# Patient Record
Sex: Male | Born: 1955 | Race: White | Hispanic: No | Marital: Married | Smoking: Never smoker
Health system: Southern US, Community
[De-identification: ages and names within clinical notes are randomized; demographics above are authoritative.]

## PROBLEM LIST (undated history)

## (undated) DIAGNOSIS — E785 Hyperlipidemia, unspecified: Secondary | ICD-10-CM

## (undated) DIAGNOSIS — I471 Supraventricular tachycardia, unspecified: Secondary | ICD-10-CM

## (undated) DIAGNOSIS — I493 Ventricular premature depolarization: Secondary | ICD-10-CM

## (undated) HISTORY — DX: Supraventricular tachycardia: I47.1

## (undated) HISTORY — DX: Ventricular premature depolarization: I49.3

## (undated) HISTORY — DX: Supraventricular tachycardia, unspecified: I47.10

## (undated) HISTORY — DX: Hyperlipidemia, unspecified: E78.5

---

## 2005-05-25 ENCOUNTER — Emergency Department (HOSPITAL_COMMUNITY): Admission: EM | Admit: 2005-05-25 | Discharge: 2005-05-25 | Payer: Self-pay | Admitting: Emergency Medicine

## 2010-12-02 NOTE — Consult Note (Signed)
Martin [name] NO.:  [id number]   MEDICAL RECORD NO.:  [id number]          PATIENT TYPE:  EMS   LOCATION:  [facility]                         FACILITY:  [facility]   PHYSICIAN:  Vella Raring, MD DATE OF BIRTH:  [birth year]   DATE OF CONSULTATION:  05/25/2005  DATE OF DISCHARGE:                                   CONSULTATION   PRIMARY CARE PHYSICIAN:  Jethro Bastos, M.D.   CHIEF COMPLAINT:  Chest pain.   HISTORY OF PRESENT ILLNESS:  The patient is a 55 year old male with no  significant past medical history.  The patient does not smoke cigarettes.  He is not diabetic.  He, at one point, had elevated cholesterol, which was  controlled on diet.  The patient says he thinks his cholesterol became  elevated again, but he has not had his lipids checked recently.  He was  never on any medications for dyslipidemia.  The patient has a significant  family history for heart disease.  Father is deceased at age 34 secondary to  a heart attack, and his paternal grandmother and grandfather both had heart  attacks at age 41.  His mother does not have any cardiac problems.  The  patient does not use cocaine.  The patient states that this evening the  patient was getting ready to go to dinner at home, and he got a substernal  chest pain.  The pain was 3-4 in maximum intensity.  The pain did not  radiate.  It lasted 10 or 12 minutes.  It was intermittent during those 10-  12 minutes.  The patient got in the car and was driving; however, the  patient became concerned with the chest pain because he had never felt pain  like this before, and decided to come to the emergency room.  There was no  associated shortness of breath, no nausea, no sweating, no tingling or  numbness.  The pain did not radiate.  The patient said for briefly, less  than a minute, he felt slight discomfort in his left shoulder.  The pain  resolved on its own in 5-10 minutes.  The patient has been chest pain  free  from the time he came to the emergency room.  The patient said he had  another treadmill stress test 4 years ago.  The patient did not have any  chest pain that prompted the stress test, but had evaluation secondary to  his family history of heart disease.  The patient states he is physically  active and walks briskly on an incline treadmill at 3.8 miles per hour  frequently.  The patient never experiences any chest pain with walking  briskly on the treadmill.   PAST MEDICAL HISTORY:  As above.   SURGICAL HISTORY:  1.  The patient has had a tonsillectomy in the past.  2.  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   FAMILY HISTORY:  Again, the patient had a father with a heart attack at age  48.  Paternal grandfather and grandmother also had heart attacks  at age 51.  The patient's mother does not have any cardiac problems.  His brothers and  sisters are also healthy.   SOCIAL HISTORY:  The patient does not smoke cigarettes.  He does not use  illicit drugs.  The patient has not used any alcohol in the past 5 years;  however, he says he used to be a heavy drinker in the past.   MEDICATIONS:  The patient does not take any medicines.   ALLERGIES:  No known drug allergies.   PHYSICAL EXAMINATION:  VITAL SIGNS:  Blood pressure of 123/79, pulse 56,  respiratory rate 20, 02 saturation 99% on room air.  HEENT:  Normocephalic and atraumatic.  No carotid bruits were heard.  CVS:  S1 and S2 were bradycardic.  No murmurs, rubs, or gallops were heard.  RESPIRATORY:  Lungs were clear to auscultation bilaterally.  There was no  wheezing or rhonchi.  ABDOMEN:  Normoactive bowel sounds.  ABDOMEN:  Soft, nontender, nondistended.  EXTREMITIES:  The patient had no edema.  There were 2+ DP pulses  bilaterally.   LABORATORY DATA:  The patient had a white count of 6.7, hemoglobin 14,  hematocrit 42, platelet count of 225.  The patient had a troponin I of less  than 0.04, CK-MB  less than 1.  The patient had repeat data.  He had a  troponin I less than 0.05, CK-MB of 1.  On the third repeat, the patient had  a troponin I of less than 0.05 and CK-MB of less than 1.  The patient had a  Chem-7 that showed a sodium of 138, potassium 3.5, chloride 106, carbon  dioxide of 0.7, BUN of 9, creatinine 1, glucose 79, calcium 9.3.  The  patient had a chest x-ray done that showed no acute findings.   EKG FINDINGS:  The patient had an EKG that showed sinus bradycardia at 57  beats per minute.  There was no acute ST elevation or ST depression.   ASSESSMENT AND PLAN:  This is a 55 year old male with a past medical history  significant for a strong family history of coronary artery disease, now  presenting with chest pain at rest.  The patient is physically active,  walking on a treadmill on incline quickly and does not have any chest pain  during these episodes.  The patient also has negative cardiac enzyme carrier  and normal EKGs, and is low risk for an acute coronary event.  Will  discharge the patient home.  The patient is expected to see his primary  medical doctor tomorrow.  The patient has a cardiologist that he sees.  The  patient states he will follow up and get a stress test within the week.  The  patient is instructed to return to the emergency room if he has return of  symptoms, worsening chest pain, shortness of breath, nausea, vomiting, or  diaphoresis.           ______________________________  Vella Raring, MD     LNB/MEDQ  D:  05/25/2005  T:  05/26/2005  Job:  [id number]

## 2016-12-18 ENCOUNTER — Telehealth: Payer: Self-pay | Admitting: Allergy and Immunology

## 2016-12-18 NOTE — Telephone Encounter (Signed)
Left message advised we could not send in epipen we have not seen him since 12/12/12 he would be considered a new patient

## 2016-12-18 NOTE — Telephone Encounter (Signed)
Agree with the plan. We probably have new patient slots in the next couple of weeks.   Malachi BondsJoel Evalynne Locurto, MD FAAAAI [facility] 

## 2016-12-18 NOTE — Telephone Encounter (Signed)
Pt called and said he needs a refill on epi-pen [facility] [address] [phone].

## 2016-12-18 NOTE — Telephone Encounter (Signed)
Martin [name] spoke to patient advised we could not send in due to not being seen. Advised we would route message to Dr. And call back

## 2016-12-21 NOTE — Telephone Encounter (Signed)
Dr Dellis AnesGallagher please advise what to be done or contact patient

## 2016-12-21 NOTE — Telephone Encounter (Signed)
Called patient to give information.  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  Gave information that Dr. Dellis AnesGallagher agreed with stated plan. Offered to make an OV.  Patient ask that Dr. Dellis AnesGallagher personally call him to discuss this. [phone].  Please advise.

## 2016-12-22 NOTE — Telephone Encounter (Signed)
I called Mr. Martin [name] to reiterate our policy. I reassured him that he would be very unlikely to have additional testing, but since we had not seen him in over three years this would make him a new patient. This is a very widespread policy and is the case in most (if not all) medical practices. I did tell him that his PCP would likely refill his EpiPen if that was all he needed. I did offer an office visit appointment, but he declined. In the end, he stated that this "did not make sense". He will be seeing his PCP about the EpiPen.  Malachi BondsJoel Gallagher, MD FAAAAI [facility]

## 2018-11-08 ENCOUNTER — Other Ambulatory Visit: Payer: Self-pay | Admitting: Orthopedic Surgery

## 2018-11-08 DIAGNOSIS — M5441 Lumbago with sciatica, right side: Secondary | ICD-10-CM

## 2018-11-19 ENCOUNTER — Ambulatory Visit
Admission: RE | Admit: 2018-11-19 | Discharge: 2018-11-19 | Disposition: A | Discharge status: Home / Self Care | Payer: Self-pay | Source: Ambulatory Visit | Attending: Orthopedic Surgery | Admitting: Orthopedic Surgery

## 2018-11-19 ENCOUNTER — Other Ambulatory Visit: Payer: Self-pay

## 2018-11-19 DIAGNOSIS — M5441 Lumbago with sciatica, right side: Secondary | ICD-10-CM

## 2021-02-08 DIAGNOSIS — E039 Hypothyroidism, unspecified: Secondary | ICD-10-CM | POA: Diagnosis not present

## 2021-02-08 DIAGNOSIS — R600 Localized edema: Secondary | ICD-10-CM | POA: Diagnosis not present

## 2021-02-08 DIAGNOSIS — E063 Autoimmune thyroiditis: Secondary | ICD-10-CM | POA: Diagnosis not present

## 2021-02-08 DIAGNOSIS — D841 Defects in the complement system: Secondary | ICD-10-CM | POA: Diagnosis not present

## 2021-02-08 DIAGNOSIS — R5383 Other fatigue: Secondary | ICD-10-CM | POA: Diagnosis not present

## 2021-02-08 DIAGNOSIS — E782 Mixed hyperlipidemia: Secondary | ICD-10-CM | POA: Diagnosis not present

## 2021-02-08 DIAGNOSIS — E509 Vitamin A deficiency, unspecified: Secondary | ICD-10-CM | POA: Diagnosis not present

## 2021-02-08 DIAGNOSIS — E7212 Methylenetetrahydrofolate reductase deficiency: Secondary | ICD-10-CM | POA: Diagnosis not present

## 2021-02-08 DIAGNOSIS — T783XXA Angioneurotic edema, initial encounter: Secondary | ICD-10-CM | POA: Diagnosis not present

## 2021-02-08 DIAGNOSIS — E279 Disorder of adrenal gland, unspecified: Secondary | ICD-10-CM | POA: Diagnosis not present

## 2021-02-08 DIAGNOSIS — E8881 Metabolic syndrome: Secondary | ICD-10-CM | POA: Diagnosis not present

## 2021-02-08 DIAGNOSIS — R7982 Elevated C-reactive protein (CRP): Secondary | ICD-10-CM | POA: Diagnosis not present

## 2021-02-08 DIAGNOSIS — E559 Vitamin D deficiency, unspecified: Secondary | ICD-10-CM | POA: Diagnosis not present

## 2021-02-09 ENCOUNTER — Emergency Department (HOSPITAL_BASED_OUTPATIENT_CLINIC_OR_DEPARTMENT_OTHER): Payer: Medicare Other | Admitting: Radiology

## 2021-02-09 ENCOUNTER — Encounter (HOSPITAL_BASED_OUTPATIENT_CLINIC_OR_DEPARTMENT_OTHER): Payer: Self-pay | Admitting: Emergency Medicine

## 2021-02-09 ENCOUNTER — Emergency Department (HOSPITAL_BASED_OUTPATIENT_CLINIC_OR_DEPARTMENT_OTHER)
Admission: EM | Admit: 2021-02-09 | Discharge: 2021-02-09 | Disposition: A | Discharge status: Home / Self Care | Payer: Medicare Other | Attending: Emergency Medicine | Admitting: Emergency Medicine

## 2021-02-09 DIAGNOSIS — I491 Atrial premature depolarization: Secondary | ICD-10-CM | POA: Diagnosis not present

## 2021-02-09 DIAGNOSIS — R002 Palpitations: Secondary | ICD-10-CM | POA: Diagnosis not present

## 2021-02-09 DIAGNOSIS — I493 Ventricular premature depolarization: Secondary | ICD-10-CM | POA: Diagnosis not present

## 2021-02-09 LAB — BASIC METABOLIC PANEL
Anion gap: 7 (ref 5–15)
BUN: 15 mg/dL (ref 8–23)
CO2: 31 mmol/L (ref 22–32)
Calcium: 9.7 mg/dL (ref 8.9–10.3)
Chloride: 102 mmol/L (ref 98–111)
Creatinine, Ser: 0.89 mg/dL (ref 0.61–1.24)
GFR, Estimated: >60 mL/min (ref >60)
Glucose, Bld: 105 mg/dL — ABNORMAL HIGH (ref 70–99)
Potassium: 4.5 mmol/L (ref 3.5–5.1)
Sodium: 140 mmol/L (ref 135–145)

## 2021-02-09 LAB — CBC
HCT: 47.2 % (ref 39.0–52.0)
Hemoglobin: 15.6 g/dL (ref 13.0–17.0)
MCH: 31.5 pg (ref 26.0–34.0)
MCHC: 33.1 g/dL (ref 30.0–36.0)
MCV: 95.2 fL (ref 80.0–100.0)
Platelets: 215 10*3/uL (ref 150–400)
RBC: 4.96 MIL/uL (ref 4.22–5.81)
RDW: 13.3 % (ref 11.5–15.5)
WBC: 6.3 10*3/uL (ref 4.0–10.5)
nRBC: 0 % (ref 0.0–0.2)

## 2021-02-09 NOTE — ED Provider Notes (Signed)
[facility] EMERGENCY DEPARTMENT Provider Note  CSN: [id number] Arrival date & time: 02/09/21 1032    History Chief Complaint  Patient presents with   Palpitations    Martin [name] is a 66 y.o. male with no significant PMH reports he has had palpitations off and on for a couple of days but [name] consistent since last night. Denies any CP or SOB with symptoms. Perhaps some mild lightheadedness. He went to [facility] and then sent to the ED for further evaluation.    History reviewed. No pertinent past medical history.  Past Surgical History:  Procedure Laterality Date   BRAIN AVM REPAIR     TONSILLECTOMY      No family history on file.  Social History   Tobacco Use   Smoking status: Never    Passive exposure: Never   Smokeless tobacco: Never  Vaping Use   Vaping Use: Never used  Substance Use Topics   Alcohol use: Not Currently   Drug use: Never     Home Medications Prior to Admission medications   Not on File     Allergies    Patient has no allergy information on record.   Review of Systems   Review of Systems A comprehensive review of systems was completed and negative except as noted in HPI.    Physical Exam BP 130/88 (BP Location: Right Arm)   Pulse (!) 59   Resp 17   Ht 6\' 3"  (1.905 m)   Wt 89.8 kg   SpO2 100%   BMI 24.75 kg/m   Physical Exam Vitals and nursing note reviewed.  Constitutional:      Appearance: Normal appearance.  HENT:     Head: Normocephalic and atraumatic.     Nose: Nose normal.     Mouth/Throat:     Mouth: Mucous membranes are moist.  Eyes:     Extraocular Movements: Extraocular movements intact.     Conjunctiva/sclera: Conjunctivae normal.  Cardiovascular:     Rate and Rhythm: Normal rate. Rhythm irregular.  Pulmonary:     Effort: Pulmonary effort is normal.     Breath sounds: Normal breath sounds.  Abdominal:     General: Abdomen is flat.     Palpations: Abdomen is soft.     Tenderness: There is no  abdominal tenderness.  Musculoskeletal:        General: No swelling. Normal range of motion.     Cervical back: Neck supple.  Skin:    General: Skin is warm and dry.  Neurological:     General: No focal deficit present.     Mental Status: He is alert.  Psychiatric:        Mood and Affect: Mood normal.     ED Results / Procedures / Treatments   Labs (all labs ordered are listed, but only abnormal results are displayed) Labs Reviewed  BASIC METABOLIC PANEL - Abnormal; Notable for the following components:      Result Value   Glucose, Bld 105 (*)    All other components within normal limits  CBC    EKG EKG Interpretation  Date/Time:  Wednesday February 09 2021 10:49:48 EDT Ventricular Rate:  68 PR Interval:  136 QRS Duration: 106 QT Interval:  414 QTC Calculation: 440 R Axis:   65 Text Interpretation: Sinus rhythm with Premature atrial complexes Incomplete right bundle branch block Borderline ECG Since last tracing Premature atrial complexes NOW PRESENT Otherwise no significant change Confirmed by [id number] [id number]) on 02/09/2021 11:27:54 AM  Radiology DG Chest 2 View  Result Date: 02/09/2021 CLINICAL DATA:  Heart palpitations EXAM: CHEST - 2 VIEW COMPARISON:  05/25/2005 FINDINGS: Heart and mediastinal contours are within normal limits. No focal opacities or effusions. No acute bony abnormality. IMPRESSION: No active cardiopulmonary disease. Electronically Signed   By: Charlett Nose M.D.   On: 02/09/2021 11:21    Procedures Procedures  Medications Ordered in the ED Medications - No data to display   MDM Rules/Calculators/A&P MDM Patient with PACs on EKG but on monitor he is having occasional groups of 5-6 rapid beats in a row, likely afib. CBC and BMP are unremarkable. CXR is clear. Will discuss with Cardiology.   ED Course  I have reviewed the triage vital signs and the nursing notes.  Pertinent labs & imaging results that were available during my care of the  patient were reviewed by me and considered in my medical decision making (see chart for details).  Clinical Course as of 02/09/21 1249  Wed Feb 09, 2021  1208 Spoke with Dr. Mendel Ryder, Cardiology who will review rhythm strip and call back with recommendations.  [CS]  1229 Dr. Katrinka Blazing does not feel his rhythm strip shows Afib, but instead frequent PACs. Recommend outpatient cardiology follow up for home monitor/ZIO patch. Does not recommend starting any medications.  [CS]    Clinical Course User Index [CS] Pollyann Savoy, MD    Final Clinical Impression(s) / ED Diagnoses Final diagnoses:  Palpitations  Premature atrial contractions    Rx / DC Orders ED Discharge Orders     None        Pollyann Savoy, MD 02/09/21 1249

## 2021-02-09 NOTE — ED Notes (Signed)
Phlebotomy/Blood work obtained/labelled/dropped of at [facility], Charity fundraiser made aware.

## 2021-02-09 NOTE — ED Triage Notes (Signed)
Pt reports palpitations off and on for 2 days, and awoke this morning with a constant "fluttering" feeling and  mild light-headedness.  Pt seen at [facility] and advised to come to ED for eval.

## 2021-03-09 NOTE — Progress Notes (Signed)
Chief Complaint  Patient presents with   New Patient (Initial Visit)    Palpitations    History of Present Illness: 65 yo male with history of hyperlipidemia and repaired brain AVM at age 45 who is here today as a new patient for the evaluation of palpitations. He was seen in the ED 02/09/21 with c/o palpitations and EKG showed PACs. Per notes, telemetry strips also showed PACs. Cardiology reviewed the strips and felt that this was clearly PACs. He says he has had hyperlipidemia but has not wished to start statins.   He tells me today that he feels well overall. He describes feeling his heart being erratic back in July 2022 before the ED visit. His heart monitor on his watch showed an irregular rhythm. Now feeling his heart skip several times per day. No chest pain. Dyspnea when his heart skips. He walks 2 miles per day.   Primary Care Physician: Farris Has, MD   Past Medical History:  Diagnosis Date   Hyperlipidemia     Past Surgical History:  Procedure Laterality Date   BRAIN AVM REPAIR     TONSILLECTOMY      Current Outpatient Medications  Medication Sig Dispense Refill   EPINEPHrine 0.3 mg/0.3 mL IJ SOAJ injection Inject 0.3 mg into the muscle as needed.     No current facility-administered medications for this visit.    No Known Allergies  Social History   Socioeconomic History   Marital status: Married    Spouse name: Not on file   Number of children: 2   Years of education: Not on file   Highest education level: Not on file  Occupational History   Occupation: Surveyor, minerals  Tobacco Use   Smoking status: Never    Passive exposure: Never   Smokeless tobacco: Never  Vaping Use   Vaping Use: Never used  Substance and Sexual Activity   Alcohol use: Not Currently   Drug use: Never   Sexual activity: Not on file  Other Topics Concern   Not on file  Social History Narrative   Not on file   Social Determinants of Health   Financial Resource Strain: Not on  file  Food Insecurity: Not on file  Transportation Needs: Not on file  Physical Activity: Not on file  Stress: Not on file  Social Connections: Not on file  Intimate Partner Violence: Not on file    Family History  Problem Relation Age of Onset   Coronary artery disease Father     Review of Systems:  As stated in the HPI and otherwise negative.   BP 112/64   Pulse (!) 48   Ht 6\' 3"  (1.905 m)   Wt 202 lb 3.2 oz (91.7 kg)   SpO2 97%   BMI 25.27 kg/m   Physical Examination: General: Well developed, well nourished, NAD  HEENT: OP clear, mucus membranes moist  SKIN: warm, dry. No rashes. Neuro: No focal deficits  Musculoskeletal: Muscle strength 5/5 all ext  Psychiatric: Mood and affect normal  Neck: No JVD, no carotid bruits, no thyromegaly, no lymphadenopathy.  Lungs:Clear bilaterally, no wheezes, rhonci, crackles Cardiovascular: Regular rate and rhythm. No murmurs, gallops or rubs. Abdomen:Soft. Bowel sounds present. Non-tender.  Extremities: No lower extremity edema. Pulses are 2 + in the bilateral DP/PT.  EKG:  EKG is not ordered today. The ekg ordered today demonstrates   Recent Labs: 02/09/2021: BUN 15; Creatinine, Ser 0.89; Hemoglobin 15.6; Platelets 215; Potassium 4.5; Sodium 140   Lipid Panel  No results found for: CHOL, TRIG, HDL, CHOLHDL, VLDL, LDLCALC, LDLDIRECT   Wt Readings from Last 3 Encounters:  03/10/21 202 lb 3.2 oz (91.7 kg)  02/09/21 198 lb (89.8 kg)      Assessment and Plan:   1. Palpitations/PACs: will plan echo and 14 day Zio patch.   2. Dyspnea on exertion: Will arrange an exercise treadmill stress test. CAD risk factors are age, FH of CAD and hyperlipidemia.   Current medicines are reviewed at length with the patient today.  The patient does not have concerns regarding medicines.  The following changes have been made:  no change  Labs/ tests ordered today include:   Orders Placed This Encounter  Procedures   Exercise Tolerance Test    LONG TERM MONITOR (3-14 DAYS)   ECHOCARDIOGRAM COMPLETE     Disposition:   F/U with me or office APP in 6-8 weeks.    Signed, Verne Carrow, MD 03/10/2021 8:43 AM    [facility] [address] [place], [place], [place]  [address] Phone: [phone]; Fax: [phone]

## 2021-03-10 ENCOUNTER — Ambulatory Visit (INDEPENDENT_AMBULATORY_CARE_PROVIDER_SITE_OTHER): Payer: Medicare Other | Admitting: Cardiovascular Disease

## 2021-03-10 ENCOUNTER — Encounter: Payer: Self-pay | Admitting: Cardiovascular Disease

## 2021-03-10 ENCOUNTER — Other Ambulatory Visit: Payer: Self-pay

## 2021-03-10 ENCOUNTER — Ambulatory Visit (INDEPENDENT_AMBULATORY_CARE_PROVIDER_SITE_OTHER): Payer: Medicare Other

## 2021-03-10 VITALS — BP 112/64 | HR 48 | Ht 75.0 in | Wt 202.2 lb

## 2021-03-10 DIAGNOSIS — R0600 Dyspnea, unspecified: Secondary | ICD-10-CM

## 2021-03-10 DIAGNOSIS — R0609 Other forms of dyspnea: Secondary | ICD-10-CM

## 2021-03-10 DIAGNOSIS — R002 Palpitations: Secondary | ICD-10-CM

## 2021-03-10 NOTE — Progress Notes (Unsigned)
Patient enrolled for [employer] to mail a 14 day ZIO XT monitor to his address on file. 

## 2021-03-10 NOTE — Patient Instructions (Signed)
Medication Instructions:  Your physician recommends that you continue on your current medications as directed. Please refer to the Current Medication list given to you today.  *If you need a refill on your cardiac medications before your next appointment, please call your pharmacy*   Lab Work: None ordered  If you have labs (blood work) drawn today and your tests are completely normal, you will receive your results only by: MyChart Message (if you have MyChart) OR A paper copy in the mail If you have any lab test that is abnormal or we need to change your treatment, we will call you to review the results.   Testing/Procedures: Your physician has requested that you have an echocardiogram. Echocardiography is a painless test that uses sound waves to create images of your heart. It provides your doctor with information about the size and shape of your heart and how well your heart's chambers and valves are working. This procedure takes approximately one hour. There are no restrictions for this procedure.   Your physician has requested that you have an exercise tolerance test. For further information please visit [email or web address] Please also follow instruction sheet, BELOW:  Nothing to eat or drink, no tobacco products 4 hours prior to the test. Dress prepared to exercise, in comfortable, 2 piece clothing and walking shoes Bring any prescription meds with you to the test If you have to cancel, we do ask that  you do in 24 hours  [phone]  ZIO XT- Long Term Monitor Instructions  Your physician has requested you wear a ZIO patch monitor for 14 days.  This is a single patch monitor. [employer] supplies one patch monitor per enrollment. Additional stickers are not available. Please do not apply patch if you will be having a Nuclear Stress Test,  Echocardiogram, Cardiac CT, MRI, or Chest Xray during the period you would be wearing the  monitor. The patch cannot be worn during these  tests. You cannot remove and re-apply the  ZIO XT patch monitor.  Your ZIO patch monitor will be mailed 3 day USPS to your address on file. It may take 3-5 days  to receive your monitor after you have been enrolled.  Once you have received your monitor, please review the enclosed instructions. Your monitor  has already been registered assigning a specific monitor serial # to you.  Billing and Patient Assistance Program Information  We have supplied [employer] with any of your insurance information on file for billing purposes. [employer] offers a sliding scale Patient Assistance Program for patients that do not have  insurance, or whose insurance does not completely cover the cost of the ZIO monitor.  You must apply for the Patient Assistance Program to qualify for this discounted rate.  To apply, please call [employer] at [phone], select option 4, select option 2, ask to apply for  Patient Assistance Program. Meredeth Ide will ask your household income, and how many people  are in your household. They will quote your out-of-pocket cost based on that information.  [employer] will also be able to set up a 51-month, interest-free payment plan if needed.  Applying the monitor   Shave hair from upper left chest.  Hold abrader disc by orange tab. Rub abrader in 40 strokes over the upper left chest as  indicated in your monitor instructions.  Clean area with 4 enclosed alcohol pads. Let dry.  Apply patch as indicated in monitor instructions. Patch will be placed under collarbone on left  side of chest with arrow pointing  upward.  Rub patch adhesive wings for 2 minutes. Remove white label marked "1". Remove the white  label marked "2". Rub patch adhesive wings for 2 additional minutes.  While looking in a mirror, press and release button in center of patch. A small green light will  flash 3-4 times. This will be your only indicator that the monitor has been turned on.  Do not shower for the first 24  hours. You may shower after the first 24 hours.  Press the button if you feel a symptom. You will hear a small click. Record Date, Time and  Symptom in the Patient Logbook.  When you are ready to remove the patch, follow instructions on the last 2 pages of Patient  Logbook. Stick patch monitor onto the last page of Patient Logbook.  Place Patient Logbook in the blue and white box. Use locking tab on box and tape box closed  securely. The blue and white box has prepaid postage on it. Please place it in the mailbox as  soon as possible. Your physician should have your test results approximately 7 days after the  monitor has been mailed back to [facility].  Call [facility] Customer Care at [phone] if you have questions regarding  your ZIO XT patch monitor. Call them immediately if you see an orange light blinking on your  monitor.  If your monitor falls off in less than 4 days, contact our Monitor department at [phone].  If your monitor becomes loose or falls off after 4 days call [employer] at [phone] for  suggestions on securing your monitor   Follow-Up: At [facility], you and your health needs are our priority.  As part of our continuing mission to provide you with exceptional heart care, we have created designated Provider Care Teams.  These Care Teams include your primary Cardiologist (physician) and Advanced Practice Providers (APPs -  Physician Assistants and Nurse Practitioners) who all work together to provide you with the care you need, when you need it.  We recommend signing up for the patient portal called "MyChart".  Sign up information is provided on this After Visit Summary.  MyChart is used to connect with patients for Virtual Visits (Telemedicine).  Patients are able to view lab/test results, encounter notes, upcoming appointments, etc.  Non-urgent messages can be sent to your provider as well.   To learn more about what you can do with MyChart, go to  [email or web address].    Your next appointment:   6-8  week(s)  The format for your next appointment:   In Person  Provider:   You may see Verne Carrow, MD    Other Instructions Echocardiogram An echocardiogram is a test that uses sound waves (ultrasound) to produce images of the heart. Images from an echocardiogram can provide important information about: Heart size and shape. The size and thickness and movement of your heart's walls. Heart muscle function and strength. Heart valve function or if you have stenosis. Stenosis is when the heart valves are too narrow. If blood is flowing backward through the heart valves (regurgitation). A tumor or infectious growth around the heart valves. Areas of heart muscle that are not working well because of poor blood flow or injury from a heart attack. Aneurysm detection. An aneurysm is a weak or damaged part of an artery wall. The wall bulges out from the normal force of blood pumping through the body. Tell a health care provider about: Any allergies you have. All medicines you are taking,  including vitamins, herbs, eye drops, creams, and over-the-counter medicines. Any blood disorders you have. Any surgeries you have had. Any medical conditions you have. Whether you are pregnant or may be pregnant. What are the risks? Generally, this is a safe test. However, problems may occur, including an allergic reaction to dye (contrast) that may be used during the test. What happens before the test? No specific preparation is needed. You may eat and drink normally. What happens during the test?  You will take off your clothes from the waist up and put on a hospital gown. Electrodes or electrocardiogram (ECG)patches may be placed on your chest. The electrodes or patches are then connected to a device that monitors your heart rate and rhythm. You will lie down on a table for an ultrasound exam. A gel will be applied to your chest to help  sound waves pass through your skin. A handheld device, called a transducer, will be pressed against your chest and moved over your heart. The transducer produces sound waves that travel to your heart and bounce back (or "echo" back) to the transducer. These sound waves will be captured in real-time and changed into images of your heart that can be viewed on a video monitor. The images will be recorded on a computer and reviewed by your health care provider. You may be asked to change positions or hold your breath for a short time. This makes it easier to get different views or better views of your heart. In some cases, you may receive contrast through an IV in one of your veins. This can improve the quality of the pictures from your heart. The procedure may vary among health care providers and hospitals. What can I expect after the test? You may return to your normal, everyday life, including diet, activities, andmedicines, unless your health care provider tells you not to do that. Follow these instructions at home: It is up to you to get the results of your test. Ask your health care provider, or the department that is doing the test, when your results will be ready. Keep all follow-up visits. This is important. Summary An echocardiogram is a test that uses sound waves (ultrasound) to produce images of the heart. Images from an echocardiogram can provide important information about the size and shape of your heart, heart muscle function, heart valve function, and other possible heart problems. You do not need to do anything to prepare before this test. You may eat and drink normally. After the echocardiogram is completed, you may return to your normal, everyday life, unless your health care provider tells you not to do that. This information is not intended to replace advice given to you by your health care provider. Make sure you discuss any questions you have with your healthcare  provider. Document Revised: 02/24/2020 Document Reviewed: 02/24/2020 Elsevier Patient Education  2022 [employer].

## 2021-03-13 DIAGNOSIS — R002 Palpitations: Secondary | ICD-10-CM

## 2021-03-13 DIAGNOSIS — R0600 Dyspnea, unspecified: Secondary | ICD-10-CM | POA: Diagnosis not present

## 2021-03-23 DIAGNOSIS — R002 Palpitations: Secondary | ICD-10-CM | POA: Diagnosis not present

## 2021-03-23 DIAGNOSIS — T783XXA Angioneurotic edema, initial encounter: Secondary | ICD-10-CM | POA: Diagnosis not present

## 2021-03-23 DIAGNOSIS — G4733 Obstructive sleep apnea (adult) (pediatric): Secondary | ICD-10-CM | POA: Diagnosis not present

## 2021-03-23 DIAGNOSIS — Z125 Encounter for screening for malignant neoplasm of prostate: Secondary | ICD-10-CM | POA: Diagnosis not present

## 2021-03-23 DIAGNOSIS — E785 Hyperlipidemia, unspecified: Secondary | ICD-10-CM | POA: Diagnosis not present

## 2021-03-25 NOTE — Addendum Note (Signed)
Addended by: Burnetta Sabin on: 03/25/2021 09:20 AM   Modules accepted: Orders

## 2021-03-25 NOTE — Addendum Note (Signed)
Addended by: Verne Carrow D on: 03/25/2021 09:53 AM   Modules accepted: Orders

## 2021-03-31 ENCOUNTER — Ambulatory Visit (INDEPENDENT_AMBULATORY_CARE_PROVIDER_SITE_OTHER): Payer: Medicare Other

## 2021-03-31 ENCOUNTER — Other Ambulatory Visit: Payer: Self-pay

## 2021-03-31 ENCOUNTER — Ambulatory Visit (HOSPITAL_COMMUNITY): Payer: Medicare Other | Attending: Cardiovascular Disease

## 2021-03-31 DIAGNOSIS — R0600 Dyspnea, unspecified: Secondary | ICD-10-CM | POA: Diagnosis not present

## 2021-03-31 DIAGNOSIS — R0609 Other forms of dyspnea: Secondary | ICD-10-CM

## 2021-03-31 DIAGNOSIS — R002 Palpitations: Secondary | ICD-10-CM | POA: Diagnosis not present

## 2021-03-31 LAB — EXERCISE TOLERANCE TEST
Angina Index: 0
Base ST Depression (mm): 0 mm
Duke Treadmill Score: 10
Estimated workload: 10.9
Exercise duration (min): 9 min
Exercise duration (sec): 30 s
MPHR: 155 {beats}/min
Peak HR: 141 {beats}/min
Percent HR: 90 %
RPE: 15
Rest HR: 55 {beats}/min
ST Depression (mm): 0 mm

## 2021-03-31 LAB — ECHOCARDIOGRAM COMPLETE
Area-P 1/2: 3.37 cm2
S' Lateral: 3.5 cm

## 2021-04-01 DIAGNOSIS — R002 Palpitations: Secondary | ICD-10-CM | POA: Diagnosis not present

## 2021-04-01 DIAGNOSIS — R0600 Dyspnea, unspecified: Secondary | ICD-10-CM | POA: Diagnosis not present

## 2021-04-06 DIAGNOSIS — T783XXA Angioneurotic edema, initial encounter: Secondary | ICD-10-CM | POA: Diagnosis not present

## 2021-04-06 DIAGNOSIS — E785 Hyperlipidemia, unspecified: Secondary | ICD-10-CM | POA: Diagnosis not present

## 2021-04-06 DIAGNOSIS — Z125 Encounter for screening for malignant neoplasm of prostate: Secondary | ICD-10-CM | POA: Diagnosis not present

## 2021-04-06 DIAGNOSIS — R002 Palpitations: Secondary | ICD-10-CM | POA: Diagnosis not present

## 2021-04-08 ENCOUNTER — Other Ambulatory Visit: Payer: Self-pay | Admitting: Nurse Practitioner

## 2021-04-08 DIAGNOSIS — I471 Supraventricular tachycardia: Secondary | ICD-10-CM

## 2021-04-08 DIAGNOSIS — I493 Ventricular premature depolarization: Secondary | ICD-10-CM

## 2021-04-08 DIAGNOSIS — R002 Palpitations: Secondary | ICD-10-CM

## 2021-04-08 NOTE — Telephone Encounter (Signed)
Martin Hazel, MD  04/05/2021  2:36 PM EDT     He is having short runs of SVT but also frequent PVCs. Since he is also bradycardic most of the time, not sure how he would tolerate addition of a beta blocker or calcium channel blocker. We can consider EP referral to get their opinion on therapy as he is still symptomatic several times per day. Thayer Ohm

## 2021-04-14 DIAGNOSIS — G4733 Obstructive sleep apnea (adult) (pediatric): Secondary | ICD-10-CM | POA: Diagnosis not present

## 2021-05-09 ENCOUNTER — Ambulatory Visit (INDEPENDENT_AMBULATORY_CARE_PROVIDER_SITE_OTHER): Payer: Medicare Other | Admitting: Cardiovascular Disease

## 2021-05-09 ENCOUNTER — Other Ambulatory Visit: Payer: Self-pay

## 2021-05-09 ENCOUNTER — Encounter: Payer: Self-pay | Admitting: Cardiovascular Disease

## 2021-05-09 VITALS — BP 112/70 | HR 68 | Ht 75.0 in | Wt 201.8 lb

## 2021-05-09 DIAGNOSIS — I471 Supraventricular tachycardia: Secondary | ICD-10-CM | POA: Diagnosis not present

## 2021-05-09 DIAGNOSIS — R0609 Other forms of dyspnea: Secondary | ICD-10-CM

## 2021-05-09 DIAGNOSIS — I493 Ventricular premature depolarization: Secondary | ICD-10-CM | POA: Diagnosis not present

## 2021-05-09 NOTE — Progress Notes (Signed)
Chief Complaint  Patient presents with   Follow-up    PVCs   History of Present Illness: 65 yo male with history of hyperlipidemia, PVCs and repaired brain AVM at age 52 who is here today for follow up. I saw him as a new patient for the evaluation of palpitations in August 2022. He was seen in the ED 02/09/21 with c/o palpitations and EKG showed PACs. Per notes, telemetry strips also showed PACs. Cardiology on call reviewed the strips and felt that this was clearly PACs. He says he has had hyperlipidemia but has not wished to start statins. At the first visit here he told me that he feels well overall. He is very active and walks 2 miles per day. He described feeling his heart being erratic back in July 2022 before the ED visit. His heart monitor on his watch showed an irregular rhythm. Since then feeling his heart skip several times per day. No chest pain. Dyspnea when his heart skips. Echo 03/31/21 with normal LV size and function, LVEF=60-65%. No significant valve disease. Exercise stress test with no ischemia. Frequent PVCs seen during exercise. Cardiac monitor with short runs of SVT and PVCs with baseline sinus bradycardia.   He tells me today that he feels well overall. He does notice feeling slightly lightheaded when he first stands up. He notices his heart "beating" during the day but is not aware of any tachycardia. He is scheduled to see Dr. Lalla Brothers in the EP clinic later this week.   Primary Care Physician: Farris Has, MD   Past Medical History:  Diagnosis Date   Hyperlipidemia    PVC (premature ventricular contraction)    SVT (supraventricular tachycardia) (HCC)     Past Surgical History:  Procedure Laterality Date   BRAIN AVM REPAIR     TONSILLECTOMY      Current Outpatient Medications  Medication Sig Dispense Refill   EPINEPHrine 0.3 mg/0.3 mL IJ SOAJ injection Inject 0.3 mg into the muscle as needed.     No current facility-administered medications for this visit.     Allergies  Allergen Reactions   Bee Venom Swelling    Feet and hands swell    Social History   Socioeconomic History   Marital status: Married    Spouse name: Not on file   Number of children: 2   Years of education: Not on file   Highest education level: Not on file  Occupational History   Occupation: Surveyor, minerals  Tobacco Use   Smoking status: Never    Passive exposure: Never   Smokeless tobacco: Never  Vaping Use   Vaping Use: Never used  Substance and Sexual Activity   Alcohol use: Not Currently   Drug use: Never   Sexual activity: Not on file  Other Topics Concern   Not on file  Social History Narrative   Not on file   Social Determinants of Health   Financial Resource Strain: Not on file  Food Insecurity: Not on file  Transportation Needs: Not on file  Physical Activity: Not on file  Stress: Not on file  Social Connections: Not on file  Intimate Partner Violence: Not on file    Family History  Problem Relation Age of Onset   Coronary artery disease Father     Review of Systems:  As stated in the HPI and otherwise negative.   BP 112/70 (BP Location: Left Arm, Patient Position: Sitting, Cuff Size: Normal)   Pulse 68   Ht 6\' 3"  (1.905  m)   Wt 201 lb 12.8 oz (91.5 kg)   SpO2 98%   BMI 25.22 kg/m   Physical Examination:  General: Well developed, well nourished, NAD  HEENT: OP clear, mucus membranes moist  SKIN: warm, dry. No rashes. Neuro: No focal deficits  Musculoskeletal: Muscle strength 5/5 all ext  Psychiatric: Mood and affect normal  Neck: No JVD, no carotid bruits, no thyromegaly, no lymphadenopathy.  Lungs:Clear bilaterally, no wheezes, rhonci, crackles Cardiovascular: Regular rate and rhythm. No murmurs, gallops or rubs. Abdomen:Soft. Bowel sounds present. Non-tender.  Extremities: No lower extremity edema. Pulses are 2 + in the bilateral DP/PT.  EKG:  EKG is not ordered today. The ekg ordered today demonstrates   Echo 03/31/21:   1. Left ventricular ejection fraction, by estimation, is 60 to 65%. Left  ventricular ejection fraction by 3D volume is 61 %. The left ventricle has  normal function. The left ventricle has no regional wall motion  abnormalities. Left ventricular diastolic   parameters were normal. The average left ventricular global longitudinal  strain is -23.6 %. The global longitudinal strain is normal.   2. Right ventricular systolic function is normal. The right ventricular  size is normal.   3. There is no mitral valve prolapse or regurgitation, despite myxomatous  changes. The mitral valve is myxomatous. No evidence of mitral valve  regurgitation. No evidence of mitral stenosis.   4. The aortic valve is normal in structure. Aortic valve regurgitation is  not visualized. No aortic stenosis is present.   5. The inferior vena cava is normal in size with greater than 50%  respiratory variability, suggesting right atrial pressure of 3 mmHg.   Recent Labs: 02/09/2021: BUN 15; Creatinine, Ser 0.89; Hemoglobin 15.6; Platelets 215; Potassium 4.5; Sodium 140   Lipid Panel No results found for: CHOL, TRIG, HDL, CHOLHDL, VLDL, LDLCALC, LDLDIRECT   Wt Readings from Last 3 Encounters:  05/09/21 201 lb 12.8 oz (91.5 kg)  03/10/21 202 lb 3.2 oz (91.7 kg)  02/09/21 198 lb (89.8 kg)      Assessment and Plan:   1. Palpitations/PVCs/SVT: Cardiac monitor with resting bradycardia, PVCs and short runs of SVT. He is feeling well overall but is concerned about the early beats and the way they make him feel with mid dyspnea.  I will not start an AV nodal blocking agent given his resting bradycardia. EP referral pending this week.   2. Dyspnea on exertion: No ischemia on stress testing. LV function normal on echo. His dyspnea is mild, not clearly related to awareness of palpitations. No further testing at this time.   Current medicines are reviewed at length with the patient today.  The patient does not have concerns  regarding medicines.  The following changes have been made:  no change  Labs/ tests ordered today include:   No orders of the defined types were placed in this encounter.    Disposition:   F/U with me or office APP in 6-8 weeks.    Signed, Verne Carrow, MD 05/09/2021 10:43 AM    [facility] [address] [place], [place], [place]  [address] Phone: [phone]; Fax: [phone]

## 2021-05-09 NOTE — Patient Instructions (Signed)
Medication Instructions:  Your physician recommends that you continue on your current medications as directed. Please refer to the Current Medication list given to you today.  *If you need a refill on your cardiac medications before your next appointment, please call your pharmacy*   Lab Work: none If you have labs (blood work) drawn today and your tests are completely normal, you will receive your results only by: MyChart Message (if you have MyChart) OR A paper copy in the mail If you have any lab test that is abnormal or we need to change your treatment, we will call you to review the results.   Testing/Procedures: None ordered   Follow-Up: As planned with Dr. Lalla Brothers.     Other Instructions

## 2021-05-12 ENCOUNTER — Other Ambulatory Visit: Payer: Self-pay

## 2021-05-12 ENCOUNTER — Encounter: Payer: Self-pay | Admitting: Cardiology

## 2021-05-12 ENCOUNTER — Ambulatory Visit (INDEPENDENT_AMBULATORY_CARE_PROVIDER_SITE_OTHER): Payer: Medicare Other | Admitting: Cardiology

## 2021-05-12 VITALS — BP 120/80 | HR 52 | Ht 75.0 in | Wt 204.4 lb

## 2021-05-12 DIAGNOSIS — I471 Supraventricular tachycardia: Secondary | ICD-10-CM

## 2021-05-12 DIAGNOSIS — I493 Ventricular premature depolarization: Secondary | ICD-10-CM | POA: Diagnosis not present

## 2021-05-12 NOTE — Patient Instructions (Addendum)
Medication Instructions:  Your physician recommends that you continue on your current medications as directed. Please refer to the Current Medication list given to you today. *If you need a refill on your cardiac medications before your next appointment, please call your pharmacy*  Lab Work: None ordered. If you have labs (blood work) drawn today and your tests are completely normal, you will receive your results only by: MyChart Message (if you have MyChart) OR A paper copy in the mail If you have any lab test that is abnormal or we need to change your treatment, we will call you to review the results.  Testing/Procedures: None ordered.  Follow-Up: At [facility], you and your health needs are our priority.  As part of our continuing mission to provide you with exceptional heart care, we have created designated Provider Care Teams.  These Care Teams include your primary Cardiologist (physician) and Advanced Practice Providers (APPs -  Physician Assistants and Nurse Practitioners) who all work together to provide you with the care you need, when you need it.  Your next appointment:   Your physician wants you to follow-up in: one year with Lanier Prude, MD.   Bonita Quin will receive a reminder letter in the mail two months in advance. If you don't receive a letter, please call our office to schedule the follow-up appointment.

## 2021-05-12 NOTE — Progress Notes (Signed)
Electrophysiology Office Note:    Date:  05/12/2021   ID:  Martin [name], DOB 28-Apr-1956, MRN [id number]  PCP:  Farris Has, MD  [facility] Cardiologist:  Verne Carrow, MD  [facility] Electrophysiologist:  Lanier Prude, MD   Referring MD: Kathleene Hazel*   Chief Complaint: SVT  History of Present Illness:    Martin [name] is a 65 y.o. male who presents for an evaluation of SVT at the request of Dr. Clifton James. Their medical history includes PVCs and SVT, brain AVM.  The patient last saw Dr. Clifton James May 09, 2021.  Today he tells me he is doing well.  He tells me that he can usually tell when he is having PVCs but they do not limit his activities.  He sometimes can hear the abnormal beat or feel an abnormal sensation in his epigastrium.  No lightheadedness or dizziness.  No syncope or presyncopal episodes.  No clear triggers.  He is interested in avoiding medications if at all possible.     Past Medical History:  Diagnosis Date   Hyperlipidemia    PVC (premature ventricular contraction)    SVT (supraventricular tachycardia) (HCC)     Past Surgical History:  Procedure Laterality Date   BRAIN AVM REPAIR     TONSILLECTOMY      Current Medications: Current Meds  Medication Sig   EPINEPHrine 0.3 mg/0.3 mL IJ SOAJ injection Inject 0.3 mg into the muscle as needed.     Allergies:   Bee venom   Social History   Socioeconomic History   Marital status: Married    Spouse name: Not on file   Number of children: 2   Years of education: Not on file   Highest education level: Not on file  Occupational History   Occupation: Surveyor, minerals  Tobacco Use   Smoking status: Never    Passive exposure: Never   Smokeless tobacco: Never  Vaping Use   Vaping Use: Never used  Substance and Sexual Activity   Alcohol use: Not Currently   Drug use: Never   Sexual activity: Not on file  Other Topics Concern   Not on file  Social History Narrative   Not on  file   Social Determinants of Health   Financial Resource Strain: Not on file  Food Insecurity: Not on file  Transportation Needs: Not on file  Physical Activity: Not on file  Stress: Not on file  Social Connections: Not on file     Family History: The patient's family history includes Coronary artery disease in his father.  ROS:   Please see the history of present illness.    All other systems reviewed and are negative.  EKGs/Labs/Other Studies Reviewed:    The following studies were reviewed today:  April 04, 2021 ZIO monitor personally reviewed Heart rate 38-1 90,  22 SVTs, longest lasting 13 seconds 8% PVCs Wenckebach is present  EKG:  The ekg ordered today demonstrates sinus rhythm.  No PVCs.   Recent Labs: 02/09/2021: BUN 15; Creatinine, Ser 0.89; Hemoglobin 15.6; Platelets 215; Potassium 4.5; Sodium 140  Recent Lipid Panel No results found for: CHOL, TRIG, HDL, CHOLHDL, VLDL, LDLCALC, LDLDIRECT  Physical Exam:    VS:  BP 120/80   Pulse (!) 52   Ht 6\' 3"  (1.905 m)   Wt 204 lb 6.4 oz (92.7 kg)   SpO2 97%   BMI 25.55 kg/m     Wt Readings from Last 3 Encounters:  05/12/21 204 lb 6.4 oz (92.7  kg)  05/09/21 201 lb 12.8 oz (91.5 kg)  03/10/21 202 lb 3.2 oz (91.7 kg)     GEN:  Well nourished, well developed in no acute distress HEENT: Normal NECK: No JVD; No carotid bruits LYMPHATICS: No lymphadenopathy CARDIAC: RRR, no murmurs, rubs, gallops RESPIRATORY:  Clear to auscultation without rales, wheezing or rhonchi  ABDOMEN: Soft, non-tender, non-distended MUSCULOSKELETAL:  No edema; No deformity  SKIN: Warm and dry NEUROLOGIC:  Alert and oriented x 3 PSYCHIATRIC:  Normal affect       ASSESSMENT:    1. SVT (supraventricular tachycardia) (HCC)   2. PVC (premature ventricular contraction)    PLAN:    In order of problems listed above:   #PVCs Minimally symptomatic.  We discussed the available options including initiation of an  antiarrhythmic, beta-blocker or conservative management.  Given his resting bradycardia and relatively minor symptoms associated with his PVCs, I would favor conservative approach at this time.  We discussed this at length during today's appointment he is in agreement.  I will plan to touch base with him in 1 year or sooner as needed.   Total time spent with patient today 45 minutes. This includes reviewing records, evaluating the patient and coordinating care.  Medication Adjustments/Labs and Tests Ordered: Current medicines are reviewed at length with the patient today.  Concerns regarding medicines are outlined above.  Orders Placed This Encounter  Procedures   EKG 12-Lead    No orders of the defined types were placed in this encounter.    Signed, Rossie Muskrat. Lalla Brothers, MD, [facility], [facility] 05/12/2021 5:07 PM    Electrophysiology [facility]

## 2021-06-30 DIAGNOSIS — G4733 Obstructive sleep apnea (adult) (pediatric): Secondary | ICD-10-CM | POA: Diagnosis not present

## 2021-09-06 DIAGNOSIS — Z0000 Encounter for general adult medical examination without abnormal findings: Secondary | ICD-10-CM | POA: Diagnosis not present

## 2021-09-06 DIAGNOSIS — E785 Hyperlipidemia, unspecified: Secondary | ICD-10-CM | POA: Diagnosis not present

## 2021-09-06 DIAGNOSIS — Z125 Encounter for screening for malignant neoplasm of prostate: Secondary | ICD-10-CM | POA: Diagnosis not present

## 2021-09-26 ENCOUNTER — Other Ambulatory Visit (HOSPITAL_COMMUNITY): Payer: Self-pay | Admitting: Family Medicine

## 2021-09-26 ENCOUNTER — Other Ambulatory Visit: Payer: Self-pay | Admitting: Family Medicine

## 2021-09-26 DIAGNOSIS — E785 Hyperlipidemia, unspecified: Secondary | ICD-10-CM

## 2021-09-26 DIAGNOSIS — R002 Palpitations: Secondary | ICD-10-CM

## 2021-10-03 ENCOUNTER — Other Ambulatory Visit (HOSPITAL_COMMUNITY): Payer: Self-pay | Admitting: Family Medicine

## 2021-10-03 DIAGNOSIS — E785 Hyperlipidemia, unspecified: Secondary | ICD-10-CM

## 2021-10-17 ENCOUNTER — Ambulatory Visit (HOSPITAL_COMMUNITY)
Admission: RE | Admit: 2021-10-17 | Discharge: 2021-10-17 | Disposition: A | Discharge status: Home / Self Care | Payer: Self-pay | Source: Ambulatory Visit | Attending: Family Medicine | Admitting: Family Medicine

## 2021-10-17 DIAGNOSIS — E785 Hyperlipidemia, unspecified: Secondary | ICD-10-CM | POA: Insufficient documentation

## 2021-10-20 DIAGNOSIS — H903 Sensorineural hearing loss, bilateral: Secondary | ICD-10-CM | POA: Diagnosis not present

## 2021-10-20 DIAGNOSIS — Z77122 Contact with and (suspected) exposure to noise: Secondary | ICD-10-CM | POA: Diagnosis not present

## 2021-10-20 DIAGNOSIS — Z822 Family history of deafness and hearing loss: Secondary | ICD-10-CM | POA: Diagnosis not present

## 2021-11-14 ENCOUNTER — Encounter: Payer: Self-pay | Admitting: Cardiovascular Disease

## 2021-11-14 ENCOUNTER — Ambulatory Visit (INDEPENDENT_AMBULATORY_CARE_PROVIDER_SITE_OTHER): Payer: Medicare Other | Admitting: Cardiovascular Disease

## 2021-11-14 VITALS — BP 134/70 | HR 85 | Ht 75.0 in | Wt 211.0 lb

## 2021-11-14 DIAGNOSIS — I471 Supraventricular tachycardia: Secondary | ICD-10-CM

## 2021-11-14 DIAGNOSIS — R0609 Other forms of dyspnea: Secondary | ICD-10-CM

## 2021-11-14 DIAGNOSIS — I2510 Atherosclerotic heart disease of native coronary artery without angina pectoris: Secondary | ICD-10-CM | POA: Diagnosis not present

## 2021-11-14 MED ORDER — ROSUVASTATIN CALCIUM 10 MG PO TABS: 10.0000 mg | ORAL_TABLET | Freq: Every day | ORAL | 3 refills | Status: DC

## 2021-11-14 NOTE — Progress Notes (Signed)
? ?Chief Complaint  ?Patient presents with  ? Follow-up  ?  CAD  ? ?History of Present Illness: 66 yo male with history of CAD (abnormal calcium score), hyperlipidemia, PVCs, SVT and repaired brain AVM at age 8 who is here today for follow up. I saw him as a new patient for the evaluation of palpitations in August 2022. He was seen in the ED 02/09/21 with c/o palpitations and EKG showed PACs. He says he has had hyperlipidemia but has not wished to start statins. He felt well at his first visit in our office reporting feeling his heart skip several times per day on most days. Dyspnea when his heart skips. Echo 03/31/21 with normal LV size and function, LVEF=60-65%. No significant valve disease. Exercise stress test with no ischemia. Frequent PVCs seen during exercise. Cardiac monitor with short runs of SVT and PVCs with baseline sinus bradycardia. He was seen by Dr. Lalla Brothers in the EP clinic and no changes were made. CT coronary calcium score April 2023 of 978.  ? ?He is here today for follow up. The patient denies any chest pain, dyspnea, palpitations, lower extremity edema, orthopnea, PND, dizziness, near syncope or syncope.  ? ?Primary Care Physician: Farris Has, MD ? ?Past Medical History:  ?Diagnosis Date  ? Hyperlipidemia   ? PVC (premature ventricular contraction)   ? SVT (supraventricular tachycardia) (HCC)   ? ? ?Past Surgical History:  ?Procedure Laterality Date  ? BRAIN AVM REPAIR    ? TONSILLECTOMY    ? ? ?Current Outpatient Medications  ?Medication Sig Dispense Refill  ? aspirin EC 81 MG tablet Take 81 mg by mouth daily. Swallow whole.    ? rosuvastatin (CRESTOR) 10 MG tablet Take 1 tablet (10 mg total) by mouth daily. 90 tablet 3  ? EPINEPHrine 0.3 mg/0.3 mL IJ SOAJ injection Inject 0.3 mg into the muscle as needed.    ? ?No current facility-administered medications for this visit.  ? ? ?Allergies  ?Allergen Reactions  ? Bee Venom Swelling  ?  Feet and hands swell  ? ? ?Social History   ? ?Socioeconomic History  ? Marital status: Married  ?  Spouse name: Not on file  ? Number of children: 2  ? Years of education: Not on file  ? Highest education level: Not on file  ?Occupational History  ? Occupation: Surveyor, minerals  ?Tobacco Use  ? Smoking status: Never  ?  Passive exposure: Never  ? Smokeless tobacco: Never  ?Vaping Use  ? Vaping Use: Never used  ?Substance and Sexual Activity  ? Alcohol use: Not Currently  ? Drug use: Never  ? Sexual activity: Not on file  ?Other Topics Concern  ? Not on file  ?Social History Narrative  ? Not on file  ? ?Social Determinants of Health  ? ?Financial Resource Strain: Not on file  ?Food Insecurity: Not on file  ?Transportation Needs: Not on file  ?Physical Activity: Not on file  ?Stress: Not on file  ?Social Connections: Not on file  ?Intimate Partner Violence: Not on file  ? ? ?Family History  ?Problem Relation Age of Onset  ? Coronary artery disease Father   ? ? ?Review of Systems:  As stated in the HPI and otherwise negative.  ? ?BP 134/70   Pulse 85   Ht 6\' 3"  (1.905 m)   Wt 211 lb (95.7 kg)   SpO2 98%   BMI 26.37 kg/m?  ? ?Physical Examination: ? ?General: Well developed, well nourished, NAD  ?HEENT:  OP clear, mucus membranes moist  ?SKIN: warm, dry. No rashes. ?Neuro: No focal deficits  ?Musculoskeletal: Muscle strength 5/5 all ext  ?Psychiatric: Mood and affect normal  ?Neck: No JVD, no carotid bruits, no thyromegaly, no lymphadenopathy.  ?Lungs:Clear bilaterally, no wheezes, rhonci, crackles ?Cardiovascular: Regular rate and rhythm. No murmurs, gallops or rubs. ?Abdomen:Soft. Bowel sounds present. Non-tender.  ?Extremities: No lower extremity edema. Pulses are 2 + in the bilateral DP/PT. ? ?EKG:  EKG is not ordered today. ?The ekg ordered today demonstrates  ? ?Echo 03/31/21: ? 1. Left ventricular ejection fraction, by estimation, is 60 to 65%. Left  ?ventricular ejection fraction by 3D volume is 61 %. The left ventricle has  ?normal function. The left  ventricle has no regional wall motion  ?abnormalities. Left ventricular diastolic  ? parameters were normal. The average left ventricular global longitudinal  ?strain is -23.6 %. The global longitudinal strain is normal.  ? 2. Right ventricular systolic function is normal. The right ventricular  ?size is normal.  ? 3. There is no mitral valve prolapse or regurgitation, despite myxomatous  ?changes. The mitral valve is myxomatous. No evidence of mitral valve  ?regurgitation. No evidence of mitral stenosis.  ? 4. The aortic valve is normal in structure. Aortic valve regurgitation is  ?not visualized. No aortic stenosis is present.  ? 5. The inferior vena cava is normal in size with greater than 50%  ?respiratory variability, suggesting right atrial pressure of 3 mmHg.  ? ?Recent Labs: ?02/09/2021: BUN 15; Creatinine, Ser 0.89; Hemoglobin 15.6; Platelets 215; Potassium 4.5; Sodium 140  ? ?Lipid Panel ?No results found for: CHOL, TRIG, HDL, CHOLHDL, VLDL, LDLCALC, LDLDIRECT ?  ?Wt Readings from Last 3 Encounters:  ?11/14/21 211 lb (95.7 kg)  ?05/12/21 204 lb 6.4 oz (92.7 kg)  ?05/09/21 201 lb 12.8 oz (91.5 kg)  ?  ? ? ?Assessment and Plan:  ? ?1. Palpitations/PVCs/SVT: He feels rare palpitations. He does not wish to take medical therapy. No other recommendations following his visit in the EP clinic.  ? ?2. Dyspnea on exertion: No ischemia on stress testing. LV function normal on echo. His dyspnea is mild, not clearly related to awareness of palpitations. No further testing at this time.  ? ?3. CAD without angina: Abnormal calcium score. I will start Crestor 10 mg daily. He will continue ASA 81 mg daily. Lipids and LFTs in 12 weeks. Goal LDL under 70.  ? ?Current medicines are reviewed at length with the patient today.  The patient does not have concerns regarding medicines. ? ?The following changes have been made:  no change ? ?Labs/ tests ordered today include:  ? ?Orders Placed This Encounter  ?Procedures  ? Lipid  panel  ? Hepatic function panel  ? ? ? ? ?Disposition:   F/U with me or office APP in 6-8 weeks.  ? ? ?Signed, ?Verne Carrow, MD ?11/14/2021 12:06 PM    ?[facility] ?[address] [place], [place], [place]  [address] ?Phone: [phone]; Fax: [phone]  ? ? ?

## 2021-11-14 NOTE — Patient Instructions (Signed)
Medication Instructions:  ?Your physician has recommended you make the following change in your medication:  ?1.) start rosuvastatin (Crestor) 10 mg - take one tablet daily ? ?*If you need a refill on your cardiac medications before your next appointment, please call your pharmacy* ? ? ?Lab Work: ?IN 3 MONTHS - please return for blood work (Lipids/Liver function) ? ? ?Testing/Procedures: ?none ? ? ?Follow-Up: ?At [facility], you and your health needs are our priority.  As part of our continuing mission to provide you with exceptional heart care, we have created designated Provider Care Teams.  These Care Teams include your primary Cardiologist (physician) and Advanced Practice Providers (APPs -  Physician Assistants and Nurse Practitioners) who all work together to provide you with the care you need, when you need it. ? ?Your next appointment:   ?12 month(s) ? ?The format for your next appointment:   ?In Person ? ?Provider:   ?Verne Carrow, MD   ? ? ?Important Information About Sugar ? ? ? ? ?  ?

## 2021-12-08 ENCOUNTER — Ambulatory Visit: Payer: Medicare Other | Admitting: Physician Assistant

## 2021-12-28 DIAGNOSIS — D1801 Hemangioma of skin and subcutaneous tissue: Secondary | ICD-10-CM | POA: Diagnosis not present

## 2021-12-28 DIAGNOSIS — L821 Other seborrheic keratosis: Secondary | ICD-10-CM | POA: Diagnosis not present

## 2021-12-28 DIAGNOSIS — L570 Actinic keratosis: Secondary | ICD-10-CM | POA: Diagnosis not present

## 2021-12-28 DIAGNOSIS — L728 Other follicular cysts of the skin and subcutaneous tissue: Secondary | ICD-10-CM | POA: Diagnosis not present

## 2021-12-28 DIAGNOSIS — L814 Other melanin hyperpigmentation: Secondary | ICD-10-CM | POA: Diagnosis not present

## 2022-02-13 ENCOUNTER — Other Ambulatory Visit: Payer: Medicare Other

## 2022-02-13 DIAGNOSIS — I2510 Atherosclerotic heart disease of native coronary artery without angina pectoris: Secondary | ICD-10-CM | POA: Diagnosis not present

## 2022-02-13 DIAGNOSIS — R0609 Other forms of dyspnea: Secondary | ICD-10-CM

## 2022-02-13 DIAGNOSIS — I471 Supraventricular tachycardia, unspecified: Secondary | ICD-10-CM

## 2022-02-13 LAB — HEPATIC FUNCTION PANEL
ALT: 35 IU/L (ref 0–44)
AST: 23 IU/L (ref 0–40)
Albumin: 4.7 g/dL (ref 3.9–4.9)
Alkaline Phosphatase: 59 IU/L (ref 44–121)
Bilirubin Total: 0.5 mg/dL (ref 0.0–1.2)
Bilirubin, Direct: 0.14 mg/dL (ref 0.00–0.40)
Total Protein: 7 g/dL (ref 6.0–8.5)

## 2022-02-13 LAB — LIPID PANEL
Chol/HDL Ratio: 4.1 ratio (ref 0.0–5.0)
Cholesterol, Total: 270 mg/dL — ABNORMAL HIGH (ref 100–199)
HDL: 66 mg/dL (ref >39)
LDL Chol Calc (NIH): 191 mg/dL — ABNORMAL HIGH (ref 0–99)
Triglycerides: 81 mg/dL (ref 0–149)
VLDL Cholesterol Cal: 13 mg/dL (ref 5–40)

## 2022-02-14 ENCOUNTER — Other Ambulatory Visit: Payer: Medicare Other

## 2022-02-16 ENCOUNTER — Telehealth: Payer: Self-pay | Admitting: *Deleted

## 2022-02-16 DIAGNOSIS — I2510 Atherosclerotic heart disease of native coronary artery without angina pectoris: Secondary | ICD-10-CM

## 2022-02-16 DIAGNOSIS — E782 Mixed hyperlipidemia: Secondary | ICD-10-CM

## 2022-02-16 NOTE — Telephone Encounter (Signed)
-----   Message from Kathleene Hazel, MD sent at 02/13/2022  4:44 PM EDT ----- LDL of 191 after being on crestor 10 mg daily. Can we check and make sure he has been taking this daily? If he has been taking daily, we need to increase the dose to 20 mg daily. If he has not been taking consistently, he needs to do so. Repeat lipids and LFTS in 12 weeks either way. cdm

## 2022-02-16 NOTE — Telephone Encounter (Signed)
The patient reports he wanted to try to decrease lipids with lifestyle/diet changes.  He started Crestor 2 days ago and will take regularly.  If he does not tolerate the medication he will call to discuss.  Scheduled follow up lab appointment for 12 weeks.

## 2022-04-27 ENCOUNTER — Encounter: Payer: Self-pay | Admitting: Cardiovascular Disease

## 2022-05-03 DIAGNOSIS — D8940 Mast cell activation, unspecified: Secondary | ICD-10-CM | POA: Diagnosis not present

## 2022-05-03 DIAGNOSIS — E039 Hypothyroidism, unspecified: Secondary | ICD-10-CM | POA: Diagnosis not present

## 2022-05-03 DIAGNOSIS — R799 Abnormal finding of blood chemistry, unspecified: Secondary | ICD-10-CM | POA: Diagnosis not present

## 2022-05-03 DIAGNOSIS — E7841 Elevated Lipoprotein(a): Secondary | ICD-10-CM | POA: Diagnosis not present

## 2022-05-03 DIAGNOSIS — R947 Abnormal results of other endocrine function studies: Secondary | ICD-10-CM | POA: Diagnosis not present

## 2022-05-03 DIAGNOSIS — E782 Mixed hyperlipidemia: Secondary | ICD-10-CM | POA: Diagnosis not present

## 2022-05-03 DIAGNOSIS — E559 Vitamin D deficiency, unspecified: Secondary | ICD-10-CM | POA: Diagnosis not present

## 2022-05-03 DIAGNOSIS — E063 Autoimmune thyroiditis: Secondary | ICD-10-CM | POA: Diagnosis not present

## 2022-05-03 DIAGNOSIS — D6489 Other specified anemias: Secondary | ICD-10-CM | POA: Diagnosis not present

## 2022-05-03 DIAGNOSIS — R7301 Impaired fasting glucose: Secondary | ICD-10-CM | POA: Diagnosis not present

## 2022-05-03 DIAGNOSIS — R946 Abnormal results of thyroid function studies: Secondary | ICD-10-CM | POA: Diagnosis not present

## 2022-05-03 DIAGNOSIS — R5383 Other fatigue: Secondary | ICD-10-CM | POA: Diagnosis not present

## 2022-05-18 ENCOUNTER — Ambulatory Visit: Payer: Medicare Other | Attending: Cardiovascular Disease

## 2022-05-18 DIAGNOSIS — I2510 Atherosclerotic heart disease of native coronary artery without angina pectoris: Secondary | ICD-10-CM | POA: Diagnosis not present

## 2022-05-18 DIAGNOSIS — E782 Mixed hyperlipidemia: Secondary | ICD-10-CM

## 2022-05-18 LAB — LIPID PANEL
Chol/HDL Ratio: 2.8 ratio (ref 0.0–5.0)
Cholesterol, Total: 182 mg/dL (ref 100–199)
HDL: 64 mg/dL (ref >39)
LDL Chol Calc (NIH): 106 mg/dL — ABNORMAL HIGH (ref 0–99)
Triglycerides: 64 mg/dL (ref 0–149)
VLDL Cholesterol Cal: 12 mg/dL (ref 5–40)

## 2022-05-18 LAB — HEPATIC FUNCTION PANEL
ALT: 50 IU/L — ABNORMAL HIGH (ref 0–44)
AST: 32 IU/L (ref 0–40)
Albumin: 4.6 g/dL (ref 3.9–4.9)
Alkaline Phosphatase: 71 IU/L (ref 44–121)
Bilirubin Total: 0.5 mg/dL (ref 0.0–1.2)
Bilirubin, Direct: 0.14 mg/dL (ref 0.00–0.40)
Total Protein: 6.9 g/dL (ref 6.0–8.5)

## 2022-05-19 ENCOUNTER — Other Ambulatory Visit: Payer: Medicare Other

## 2022-06-29 DIAGNOSIS — I788 Other diseases of capillaries: Secondary | ICD-10-CM | POA: Diagnosis not present

## 2022-06-29 DIAGNOSIS — L570 Actinic keratosis: Secondary | ICD-10-CM | POA: Diagnosis not present

## 2022-07-11 IMAGING — CT CT CARDIAC CORONARY ARTERY CALCIUM SCORE
4 series · 12 of 20 positions shown, 13 images · non-contrast
Comparison: None.
COMPARISON: None.

Addendum:
EXAM:
OVER-READ INTERPRETATION  CT CHEST

The following report is an over-read performed by radiologist Dr.
over-read does not include interpretation of cardiac or coronary
anatomy or pathology. The coronary calcium score interpretation by
the cardiologist is attached.
CLINICAL DATA: 65M for cardiovascular disease risk stratification
Coronary Calcium Score
TECHNIQUE: A gated, non-contrast computed tomography scan of the heart was
performed using 3mm slice thickness. Axial images were analyzed on a
dedicated workstation. Calcium scoring of the coronary arteries was
performed using the Agatston method.

[Series 3: ax ca scr 70% (id) · axial · 0.39mm/px · z∈[+696,+860]mm · 6 of 116 slices shown]
[im 17/116  vessel]
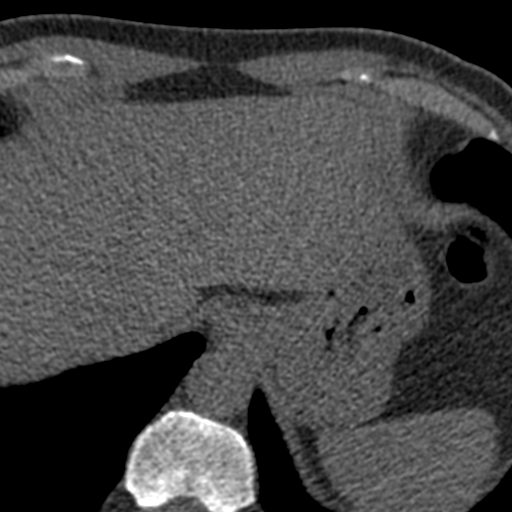
[im 33/116  vessel]
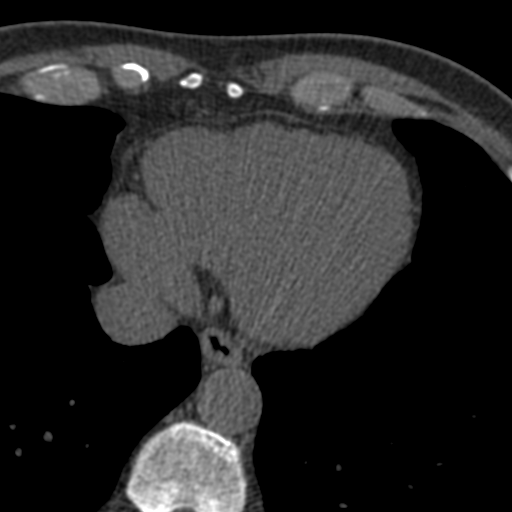
[im 50/116  vessel]
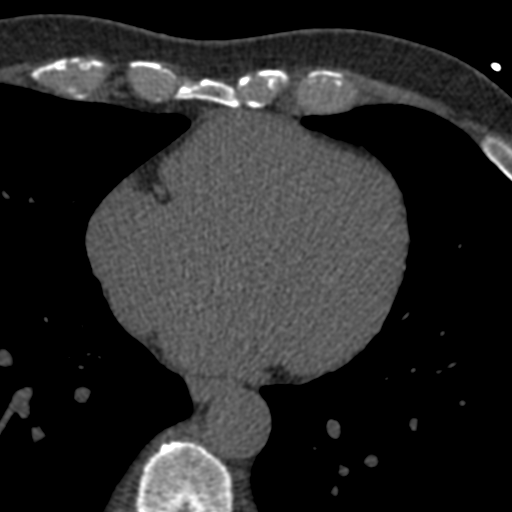
[im 66/116  vessel]
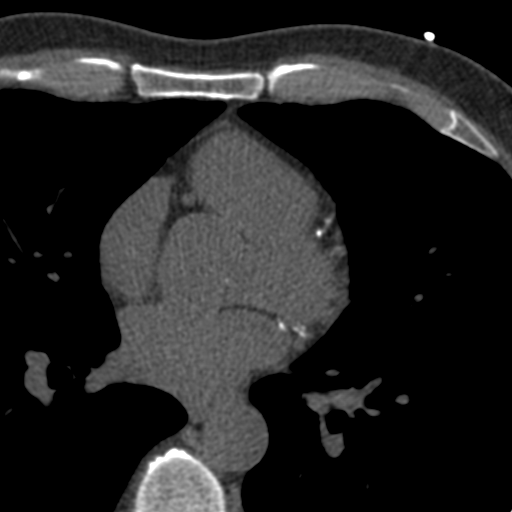
[im 83/116  vessel]
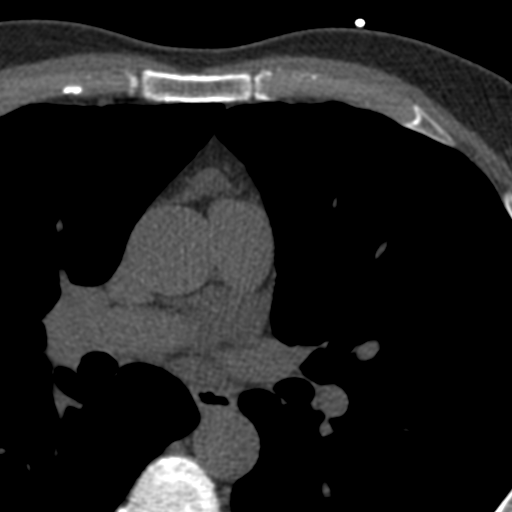
[im 99/116  vessel]
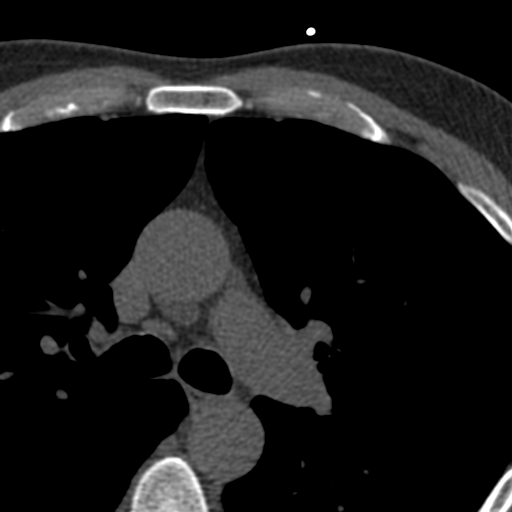

[Series 4: ax st · axial · 0.39mm/px · z∈[+741,+816]mm · 2 of 77 slices shown, 3 images]
[im 26/77  vessel]
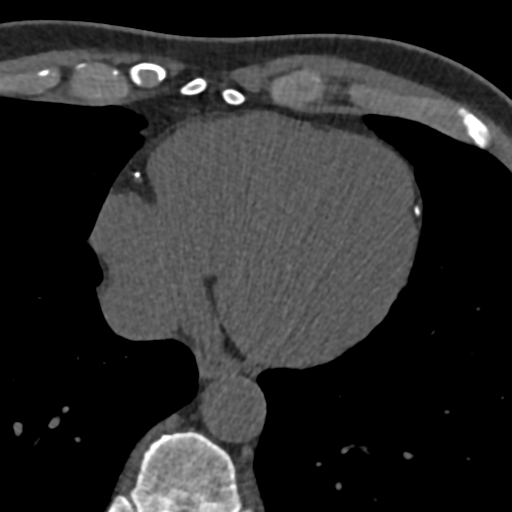
[im 26/77  lung]
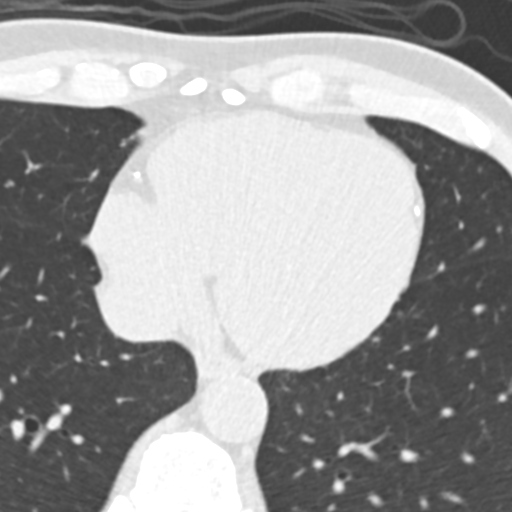
[im 51/77  vessel]
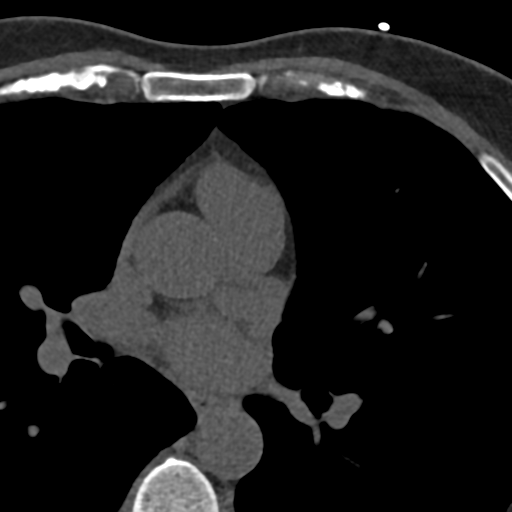

[Series 5: ax lung · axial · 0.70mm/px · z∈[+741,+816]mm · 2 of 77 slices shown]
[im 26/77  lung]
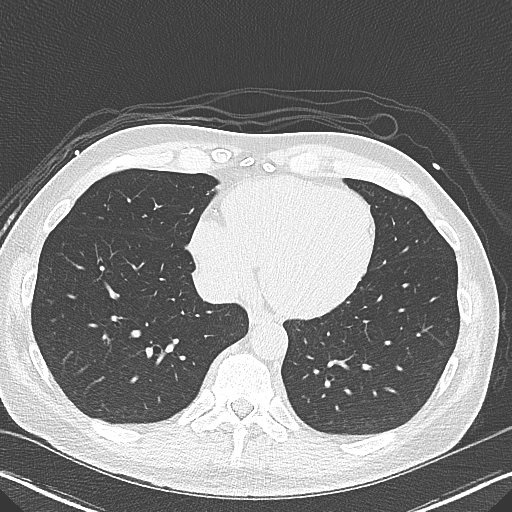
[im 51/77  lung]
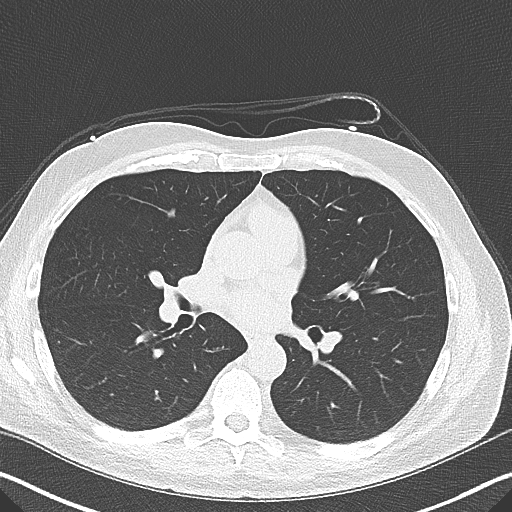

[Series 7: ax full · axial · 0.82mm/px · z∈[+741,+816]mm · 2 of 77 slices shown]
[im 26/77  vessel]
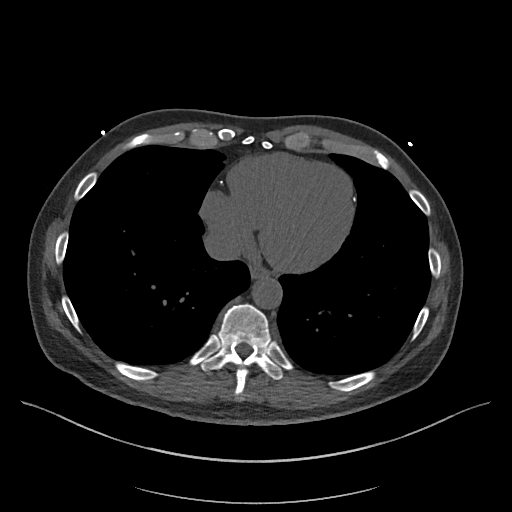
[im 51/77  vessel]
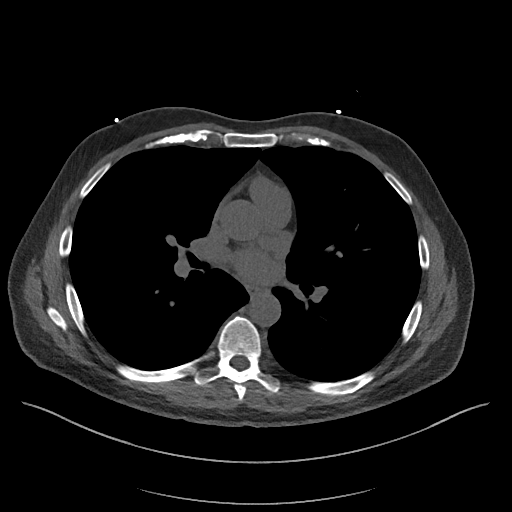

[12 of 20 positions shown; findings below may reference images not displayed]

FINDINGS: Vascular: No significant noncardiac vascular findings.

Mediastinum/Nodes: Visualized mediastinum and hilar regions
demonstrate no lymphadenopathy or masses.

Lungs/Pleura: Small calcified granuloma in the posterior right upper
lobe. Vague 3 mm nodule is within with the minor fissure of the
right lung. Visualized lungs show no evidence of pulmonary edema,
consolidation, pneumothorax or pleural fluid.

Upper Abdomen: No acute abnormality.

Musculoskeletal: No chest wall mass or suspicious bone lesions
identified.
IMPRESSION: 1. Small calcified granuloma in the posterior right upper lobe is
consistent with prior granulomatous disease.
2. 3 mm fissural nodule within the minor fissure of the right lung
is most likely consistent with an intrapulmonary lymph node.
3 mm right solid pulmonary nodule. No routine follow-up imaging is
recommended per [HOSPITAL] Guidelines.
These guidelines do not apply to immunocompromised patients and
patients with cancer. Follow up in patients with significant
comorbidities as clinically warranted. For lung cancer screening,
adhere to Lung-RADS guidelines. Reference: Radiology. 5687;
FINDINGS: Coronary arteries: Normal origins.

Coronary Calcium Score:

Left main: 0

Left anterior descending artery: 419

Left circumflex artery: 368

Right coronary artery: 192

Total: 978

Percentile: 92nd

Pericardium: Normal.

Ascending Aorta: Normal caliber.  3.4 cm.  Aortic atherosclerosis.

Non-cardiac: See separate report from [REDACTED].
IMPRESSION: Coronary calcium score of 978. This was 92nd percentile for age-,
race-, and sex-matched controls.



If CAC=0, it is reasonable to withhold statin therapy and reassess
in 5 to 10 years, as long as higher risk conditions are absent
(diabetes mellitus, family history of premature CHD in first degree
relatives (males <55 years; females <65 years), cigarette smoking,
or LDL >=190 mg/dL).

If CAC is 1 to 99, it is reasonable to initiate statin therapy for
patients >=55 years of age.

If CAC is >=100 or >=75th percentile, it is reasonable to initiate
statin therapy at any age.

Cardiology referral should be considered for patients with CAC
scores >=400 or >=75th percentile.

*2713 AHA/ACC/AACVPR/AAPA/ABC/YEYU/KRISTOPER/MANTILLA GARCIA/Reiner/KAABOURA/PERINTHALMANNA/HERSELMAN
Guideline on the Management of Blood Cholesterol: A Report of the
American College of Cardiology/American Heart Association Task Force
on Clinical Practice Guidelines. J Am Coll Cardiol.
6540;73(24):1586-1973.

*** End of Addendum ***
EXAM:
OVER-READ INTERPRETATION  CT CHEST

The following report is an over-read performed by radiologist Dr.
over-read does not include interpretation of cardiac or coronary
anatomy or pathology. The coronary calcium score interpretation by
the cardiologist is attached.
FINDINGS: Vascular: No significant noncardiac vascular findings.

Mediastinum/Nodes: Visualized mediastinum and hilar regions
demonstrate no lymphadenopathy or masses.

Lungs/Pleura: Small calcified granuloma in the posterior right upper
lobe. Vague 3 mm nodule is within with the minor fissure of the
right lung. Visualized lungs show no evidence of pulmonary edema,
consolidation, pneumothorax or pleural fluid.

Upper Abdomen: No acute abnormality.

Musculoskeletal: No chest wall mass or suspicious bone lesions
identified.
IMPRESSION: 1. Small calcified granuloma in the posterior right upper lobe is
consistent with prior granulomatous disease.
2. 3 mm fissural nodule within the minor fissure of the right lung
is most likely consistent with an intrapulmonary lymph node.
3 mm right solid pulmonary nodule. No routine follow-up imaging is
recommended per [HOSPITAL] Guidelines.
These guidelines do not apply to immunocompromised patients and
patients with cancer. Follow up in patients with significant
comorbidities as clinically warranted. For lung cancer screening,
adhere to Lung-RADS guidelines. Reference: Radiology. 5687;

## 2022-07-27 DIAGNOSIS — G4733 Obstructive sleep apnea (adult) (pediatric): Secondary | ICD-10-CM | POA: Diagnosis not present

## 2022-08-23 DIAGNOSIS — E063 Autoimmune thyroiditis: Secondary | ICD-10-CM | POA: Diagnosis not present

## 2022-08-23 DIAGNOSIS — R799 Abnormal finding of blood chemistry, unspecified: Secondary | ICD-10-CM | POA: Diagnosis not present

## 2022-08-23 DIAGNOSIS — R600 Localized edema: Secondary | ICD-10-CM | POA: Diagnosis not present

## 2022-08-23 DIAGNOSIS — D6489 Other specified anemias: Secondary | ICD-10-CM | POA: Diagnosis not present

## 2022-08-23 DIAGNOSIS — E782 Mixed hyperlipidemia: Secondary | ICD-10-CM | POA: Diagnosis not present

## 2022-08-23 DIAGNOSIS — R5383 Other fatigue: Secondary | ICD-10-CM | POA: Diagnosis not present

## 2022-08-23 DIAGNOSIS — R947 Abnormal results of other endocrine function studies: Secondary | ICD-10-CM | POA: Diagnosis not present

## 2022-08-23 DIAGNOSIS — R946 Abnormal results of thyroid function studies: Secondary | ICD-10-CM | POA: Diagnosis not present

## 2022-08-23 DIAGNOSIS — E039 Hypothyroidism, unspecified: Secondary | ICD-10-CM | POA: Diagnosis not present

## 2022-08-23 DIAGNOSIS — E559 Vitamin D deficiency, unspecified: Secondary | ICD-10-CM | POA: Diagnosis not present

## 2022-08-23 DIAGNOSIS — R7301 Impaired fasting glucose: Secondary | ICD-10-CM | POA: Diagnosis not present

## 2022-08-23 DIAGNOSIS — D8940 Mast cell activation, unspecified: Secondary | ICD-10-CM | POA: Diagnosis not present

## 2022-08-30 DIAGNOSIS — R319 Hematuria, unspecified: Secondary | ICD-10-CM | POA: Diagnosis not present

## 2022-09-04 ENCOUNTER — Other Ambulatory Visit: Payer: Self-pay | Admitting: Family Medicine

## 2022-09-04 DIAGNOSIS — R319 Hematuria, unspecified: Secondary | ICD-10-CM

## 2022-09-07 DIAGNOSIS — R600 Localized edema: Secondary | ICD-10-CM | POA: Diagnosis not present

## 2022-09-07 DIAGNOSIS — D8940 Mast cell activation, unspecified: Secondary | ICD-10-CM | POA: Diagnosis not present

## 2022-09-07 DIAGNOSIS — N644 Mastodynia: Secondary | ICD-10-CM | POA: Diagnosis not present

## 2022-09-07 DIAGNOSIS — Z91040 Latex allergy status: Secondary | ICD-10-CM | POA: Diagnosis not present

## 2022-09-07 DIAGNOSIS — Z91018 Allergy to other foods: Secondary | ICD-10-CM | POA: Diagnosis not present

## 2022-09-07 DIAGNOSIS — R609 Edema, unspecified: Secondary | ICD-10-CM | POA: Diagnosis not present

## 2022-09-07 DIAGNOSIS — N210 Calculus in bladder: Secondary | ICD-10-CM | POA: Diagnosis not present

## 2022-09-07 DIAGNOSIS — J302 Other seasonal allergic rhinitis: Secondary | ICD-10-CM | POA: Diagnosis not present

## 2022-09-07 DIAGNOSIS — J309 Allergic rhinitis, unspecified: Secondary | ICD-10-CM | POA: Diagnosis not present

## 2022-09-07 DIAGNOSIS — T783XXA Angioneurotic edema, initial encounter: Secondary | ICD-10-CM | POA: Diagnosis not present

## 2022-09-07 DIAGNOSIS — R319 Hematuria, unspecified: Secondary | ICD-10-CM | POA: Diagnosis not present

## 2022-09-07 DIAGNOSIS — D841 Defects in the complement system: Secondary | ICD-10-CM | POA: Diagnosis not present

## 2022-09-26 DIAGNOSIS — N400 Enlarged prostate without lower urinary tract symptoms: Secondary | ICD-10-CM | POA: Diagnosis not present

## 2022-09-26 DIAGNOSIS — Z125 Encounter for screening for malignant neoplasm of prostate: Secondary | ICD-10-CM | POA: Diagnosis not present

## 2022-09-26 DIAGNOSIS — R310 Gross hematuria: Secondary | ICD-10-CM | POA: Diagnosis not present

## 2022-10-03 ENCOUNTER — Ambulatory Visit
Admission: RE | Admit: 2022-10-03 | Discharge: 2022-10-03 | Disposition: A | Discharge status: Home / Self Care | Payer: Medicare Other | Source: Ambulatory Visit | Attending: Family Medicine | Admitting: Family Medicine

## 2022-10-03 DIAGNOSIS — R319 Hematuria, unspecified: Secondary | ICD-10-CM

## 2022-10-03 MED ORDER — IOPAMIDOL (ISOVUE-300) INJECTION 61%: 125.0000 mL | Freq: Once | INTRAVENOUS | Status: DC | PRN

## 2022-10-03 MED ORDER — IOPAMIDOL (ISOVUE-300) INJECTION 61%
125.0000 mL | Freq: Once | INTRAVENOUS | Status: AC | PRN
  Administered 2022-10-03: 125 mL via INTRAVENOUS

## 2022-11-08 DIAGNOSIS — N400 Enlarged prostate without lower urinary tract symptoms: Secondary | ICD-10-CM | POA: Diagnosis not present

## 2022-11-08 DIAGNOSIS — R310 Gross hematuria: Secondary | ICD-10-CM | POA: Diagnosis not present

## 2022-12-16 ENCOUNTER — Other Ambulatory Visit: Payer: Self-pay | Admitting: Cardiovascular Disease

## 2022-12-18 NOTE — Telephone Encounter (Signed)
Pt will need to make overdue appt for future refills Thanks 1st attempt

## 2023-01-03 DIAGNOSIS — Z1159 Encounter for screening for other viral diseases: Secondary | ICD-10-CM | POA: Diagnosis not present

## 2023-01-03 DIAGNOSIS — E785 Hyperlipidemia, unspecified: Secondary | ICD-10-CM | POA: Diagnosis not present

## 2023-01-03 DIAGNOSIS — Z1211 Encounter for screening for malignant neoplasm of colon: Secondary | ICD-10-CM | POA: Diagnosis not present

## 2023-01-03 DIAGNOSIS — Z0000 Encounter for general adult medical examination without abnormal findings: Secondary | ICD-10-CM | POA: Diagnosis not present

## 2023-01-03 DIAGNOSIS — Z125 Encounter for screening for malignant neoplasm of prostate: Secondary | ICD-10-CM | POA: Diagnosis not present

## 2023-01-03 LAB — COMPREHENSIVE METABOLIC PANEL: EGFR: 94

## 2023-01-22 ENCOUNTER — Other Ambulatory Visit: Payer: Self-pay | Admitting: Cardiovascular Disease

## 2023-03-15 DIAGNOSIS — Z1211 Encounter for screening for malignant neoplasm of colon: Secondary | ICD-10-CM | POA: Diagnosis not present

## 2023-04-13 DIAGNOSIS — M792 Neuralgia and neuritis, unspecified: Secondary | ICD-10-CM | POA: Diagnosis not present

## 2023-04-13 DIAGNOSIS — M79606 Pain in leg, unspecified: Secondary | ICD-10-CM | POA: Diagnosis not present

## 2023-04-16 ENCOUNTER — Other Ambulatory Visit: Payer: Self-pay | Admitting: Family Medicine

## 2023-04-16 DIAGNOSIS — M5410 Radiculopathy, site unspecified: Secondary | ICD-10-CM

## 2023-04-16 DIAGNOSIS — M48061 Spinal stenosis, lumbar region without neurogenic claudication: Secondary | ICD-10-CM | POA: Diagnosis not present

## 2023-04-16 DIAGNOSIS — M25552 Pain in left hip: Secondary | ICD-10-CM | POA: Diagnosis not present

## 2023-04-24 DIAGNOSIS — K648 Other hemorrhoids: Secondary | ICD-10-CM | POA: Diagnosis not present

## 2023-04-24 DIAGNOSIS — K5730 Diverticulosis of large intestine without perforation or abscess without bleeding: Secondary | ICD-10-CM | POA: Diagnosis not present

## 2023-04-24 DIAGNOSIS — Z1211 Encounter for screening for malignant neoplasm of colon: Secondary | ICD-10-CM | POA: Diagnosis not present

## 2023-04-24 DIAGNOSIS — D122 Benign neoplasm of ascending colon: Secondary | ICD-10-CM | POA: Diagnosis not present

## 2023-04-26 DIAGNOSIS — D122 Benign neoplasm of ascending colon: Secondary | ICD-10-CM | POA: Diagnosis not present

## 2023-04-30 ENCOUNTER — Other Ambulatory Visit: Payer: Medicare Other

## 2023-04-30 DIAGNOSIS — H2513 Age-related nuclear cataract, bilateral: Secondary | ICD-10-CM | POA: Diagnosis not present

## 2023-04-30 DIAGNOSIS — H5213 Myopia, bilateral: Secondary | ICD-10-CM | POA: Diagnosis not present

## 2023-04-30 DIAGNOSIS — H52203 Unspecified astigmatism, bilateral: Secondary | ICD-10-CM | POA: Diagnosis not present

## 2023-05-15 NOTE — Progress Notes (Signed)
Cardiology Clinic Note   Patient Name: Martin [name] Date of Encounter: 05/18/2023  Primary Care Provider:  Farris Has, MD Primary Cardiologist:  Verne Carrow, MD  Patient Profile    Martin [name] 67 year old male presents to the clinic today for follow-up evaluation of his hypertension, coronary artery disease, and hyperlipidemia.  Past Medical History    Past Medical History:  Diagnosis Date   Hyperlipidemia    PVC (premature ventricular contraction)    SVT (supraventricular tachycardia) (HCC)    Past Surgical History:  Procedure Laterality Date   BRAIN AVM REPAIR     TONSILLECTOMY      Allergies  Allergies  Allergen Reactions   Bee Venom Swelling    Feet and hands swell    History of Present Illness    Martin [name] has a PMH of coronary artery disease (elevated calcium score), HLD, PVCs, repaired brain AVM at age 12, SVT, and DOE.  He was initially seen for evaluation of palpitations and August 2022.  He had been seen in the emergency department 7/22 and complained of palpitations.  His EKG showed PACs.  During his evaluation he reported feeling his heart skip several times per day on most days.  He did note dyspnea when his heart would skip.  His echocardiogram 9/22 showed normal LV function with an EF of 60 to 65%, no significant valvular abnormalities.  He underwent exercise stress testing which showed no ischemia.  Frequent PVCs were noted during exercise.  He wore a cardiac event monitor which showed short runs of SVT and PVCs with a baseline sinus bradycardia.  He followed up with Dr. Lalla Brothers and no changes were made.  He had coronary calcium scoring 4/23 which showed a value of 978.  He was seen in follow-up by Dr. Clifton James on 11/15/2022.  During that time he denied chest pain, dyspnea, palpitations, lower extremity swelling, PND, dizziness, presyncope and syncope.  He contacted the nurse triage line and reported his blood pressures had been  increasing.  He presents to the clinic today for follow-up evaluation and states he continues to be very physically active walking about 2 miles per day, using his stationary bike, and doing resistance training.  We reviewed his previous clinic visit and coronary calcium scoring.  We reviewed his rosuvastatin.  He reports that he does notice some muscle soreness that he feels is related to the medication.  We reviewed goal LDL and total cholesterol.  He expressed understanding.  I will repeat his fasting lipids and LFTs and we will plan follow-up in 9 to 12 months.  Today he denies chest pain, shortness of breath, lower extremity edema, fatigue, palpitations, melena, hematuria, hemoptysis, diaphoresis, weakness, presyncope, syncope, orthopnea, and PND.   Home Medications    Prior to Admission medications   Medication Sig Start Date End Date Taking? Authorizing Provider  aspirin EC 81 MG tablet Take 81 mg by mouth daily. Swallow whole.    [provider]  EPINEPHrine 0.3 mg/0.3 mL IJ SOAJ injection Inject 0.3 mg into the muscle as needed. 02/08/21   [provider]  rosuvastatin (CRESTOR) 10 MG tablet TAKE 1 TABLET(10 MG) BY MOUTH DAILY 01/22/23   Kathleene Hazel, MD    Family History    Family History  Problem Relation Age of Onset   Coronary artery disease Father    He indicated that his mother is alive. He indicated that his father is deceased.  Social History    Social History  Socioeconomic History   Marital status: Married    Spouse name: Not on file   Number of children: 2   Years of education: Not on file   Highest education level: Not on file  Occupational History   Occupation: Surveyor, minerals  Tobacco Use   Smoking status: Never    Passive exposure: Never   Smokeless tobacco: Never  Vaping Use   Vaping status: Never Used  Substance and Sexual Activity   Alcohol use: Not Currently   Drug use: Never   Sexual activity: Not on file  Other Topics  Concern   Not on file  Social History Narrative   Not on file   Social Determinants of Health   Financial Resource Strain: Not on file  Food Insecurity: Not on file  Transportation Needs: Not on file  Physical Activity: Not on file  Stress: Not on file  Social Connections: Not on file  Intimate Partner Violence: Not on file     Review of Systems    General:  No chills, fever, night sweats or weight changes.  Cardiovascular:  No chest pain, dyspnea on exertion, edema, orthopnea, palpitations, paroxysmal nocturnal dyspnea. Dermatological: No rash, lesions/masses Respiratory: No cough, dyspnea Urologic: No hematuria, dysuria Abdominal:   No nausea, vomiting, diarrhea, bright red blood per rectum, melena, or hematemesis Neurologic:  No visual changes, wkns, changes in mental status. All other systems reviewed and are otherwise negative except as noted above.  Physical Exam    VS:  BP 128/80 (BP Location: Left Arm, Patient Position: Sitting)   Pulse 60   Ht 6\' 2"  (1.88 m)   Wt 214 lb (97.1 kg)   SpO2 97%   BMI 27.48 kg/m  , BMI Body mass index is 27.48 kg/m. GEN: Well nourished, well developed, in no acute distress. HEENT: normal. Neck: Supple, no JVD, carotid bruits, or masses. Cardiac: RRR, no murmurs, rubs, or gallops. No clubbing, cyanosis, edema.  Radials/DP/PT 2+ and equal bilaterally.  Respiratory:  Respirations regular and unlabored, clear to auscultation bilaterally. GI: Soft, nontender, nondistended, BS + x 4. MS: no deformity or atrophy. Skin: warm and dry, no rash. Neuro:  Strength and sensation are intact. Psych: Normal affect.  Accessory Clinical Findings    Recent Labs: No results found for requested labs within last 365 days.   Recent Lipid Panel    Component Value Date/Time   CHOL 182 05/18/2022 0749   TRIG 64 05/18/2022 0749   HDL 64 05/18/2022 0749   CHOLHDL 2.8 05/18/2022 0749   LDLCALC 106 (H) 05/18/2022 0749         ECG personally  reviewed by me today- EKG Interpretation Date/Time:  Friday May 18 2023 08:10:33 EDT Ventricular Rate:  60 PR Interval:  142 QRS Duration:  106 QT Interval:  434 QTC Calculation: 434 R Axis:   39  Text Interpretation: Sinus rhythm with occasional Premature ventricular complexes When compared with ECG of 09-Feb-2021 10:49, Premature ventricular complexes are now Present Premature atrial complexes are no longer Present Confirmed by Edd Fabian [id number]) on 05/18/2023 8:20:14 AM   Echocardiogram 03/31/2021  1. Left ventricular ejection fraction, by estimation, is 60 to 65%. Left  ventricular ejection fraction by 3D volume is 61 %. The left ventricle has  normal function. The left ventricle has no regional wall motion  abnormalities. Left ventricular diastolic   parameters were normal. The average left ventricular global longitudinal  strain is -23.6 %. The global longitudinal strain is normal.   2. Right ventricular  systolic function is normal. The right ventricular  size is normal.   3. There is no mitral valve prolapse or regurgitation, despite myxomatous  changes. The mitral valve is myxomatous. No evidence of mitral valve  regurgitation. No evidence of mitral stenosis.   4. The aortic valve is normal in structure. Aortic valve regurgitation is  not visualized. No aortic stenosis is present.   5. The inferior vena cava is normal in size with greater than 50%  respiratory variability, suggesting right atrial pressure of 3 mmHg.      Assessment & Plan   1.  Essential hypertension-BP today 128/80. Maintain blood pressure log Avoid secondary causes of hypertension. Start carvedilol 3.125 mg twice daily  Coronary artery disease-no chest pain today.  Denies recent episodes of neck back and chest discomfort.  Had elevated coronary scoring 4/23 with a value of 978. Continue current medical therapy Heart healthy low-sodium diet Increase physical activity as  tolerated  Hyperlipidemia-LDL 106 on 05/18/22. High-fiber diet Continue aspirin, rosuvastatin Repeat fasting lipids and lfts  History of SVT, PACs, PVCs-heart rate today 60. Maintain p.o. hydration Avoid triggers caffeine, chocolate, EtOH, dehydration etc.  Disposition: Follow-up with Dr. Clifton James or me in 1-2 months.   Thomasene Ripple. Martin Boltz NP-C     05/18/2023, 8:20 AM [facility] [address] Office [phone] Fax [phone]    I spent 14 minutes examining this patient, reviewing medications, and using patient centered shared decision making involving her cardiac care.   I spent greater than 20 minutes reviewing her past medical history,  medications, and prior cardiac tests.

## 2023-05-18 ENCOUNTER — Encounter: Payer: Self-pay | Admitting: General Practice

## 2023-05-18 ENCOUNTER — Ambulatory Visit: Payer: Medicare Other | Attending: General Practice | Admitting: General Practice

## 2023-05-18 VITALS — BP 128/80 | HR 60 | Ht 74.0 in | Wt 214.0 lb

## 2023-05-18 DIAGNOSIS — I10 Essential (primary) hypertension: Secondary | ICD-10-CM | POA: Insufficient documentation

## 2023-05-18 DIAGNOSIS — E782 Mixed hyperlipidemia: Secondary | ICD-10-CM | POA: Diagnosis not present

## 2023-05-18 DIAGNOSIS — I471 Supraventricular tachycardia, unspecified: Secondary | ICD-10-CM | POA: Insufficient documentation

## 2023-05-18 DIAGNOSIS — I2510 Atherosclerotic heart disease of native coronary artery without angina pectoris: Secondary | ICD-10-CM | POA: Diagnosis not present

## 2023-05-18 MED ORDER — ROSUVASTATIN CALCIUM 10 MG PO TABS: 10.0000 mg | ORAL_TABLET | Freq: Every day | ORAL | 4 refills | Status: DC

## 2023-05-18 NOTE — Patient Instructions (Signed)
Medication Instructions:  The current medical regimen is effective;  continue present plan and medications as directed. Please refer to the Current Medication list given to you today.  *If you need a refill on your cardiac medications before your next appointment, please call your pharmacy*  Lab Work: LIPID AND LFT TODAY If you have labs (blood work) drawn today and your tests are completely normal, you will receive your results only by:    MyChart Message (if you have MyChart) OR   A paper copy in the mail If you have any lab test that is abnormal or we need to change your treatment, we will call you to review the results.  Follow-Up: At [facility], you and your health needs are our priority.  As part of our continuing mission to provide you with exceptional heart care, we have created designated Provider Care Teams.  These Care Teams include your primary Cardiologist (physician) and Advanced Practice Providers (APPs -  Physician Assistants and Nurse Practitioners) who all work together to provide you with the care you need, when you need it.  We recommend signing up for the patient portal called "MyChart".  Sign up information is provided on this After Visit Summary.  MyChart is used to connect with patients for Virtual Visits (Telemedicine).  Patients are able to view lab/test results, encounter notes, upcoming appointments, etc.  Non-urgent messages can be sent to your provider as well.   To learn more about what you can do with MyChart, go to [email or web address].    Your next appointment:   9-12 month(s)  Provider:   Verne Carrow, MD  or Edd Fabian, FNP        Other Instructions MAINTAIN YOUR PHYSICAL ACTIVITY High-Fiber Eating Plan Fiber, also called dietary fiber, is found in foods such as fruits, vegetables, whole grains, and beans. A high-fiber diet can be good for your health. Your health care provider may recommend a high-fiber diet to help: Prevent  trouble pooping (constipation). Lower your cholesterol. Treat the following conditions: Hemorrhoids. This is inflammation of veins in the anus. Inflammation of specific areas of the digestive tract. Irritable bowel syndrome (IBS). This is a problem of the large intestine, also called the colon, that sometimes causes belly pain and bloating. Prevent overeating as part of a weight-loss plan. Lower the risk of heart disease, type 2 diabetes, and certain cancers. What are tips for following this plan? Reading food labels  Check the nutrition facts label on foods for the amount of dietary fiber. Choose foods that have 4 grams of fiber or more per serving. The recommended goals for how much fiber you should eat each day include: Males 38 years old or younger: 30-34 g. Males over 60 years old: 28-34 g. Females 49 years old or younger: 25-28 g. Females over 22 years old: 22-25 g. Your daily fiber goal is _____________ g. Shopping Choose whole fruits and vegetables instead of processed. For example, choose apples instead of apple juice or applesauce. Choose a variety of high-fiber foods such as avocados, lentils, oats, and pinto beans. Read the nutrition facts label on foods. Check for foods with added fiber. These foods often have high sugar and salt (sodium) amounts per serving. Cooking Use whole-grain flour for baking and cooking. Cook with brown rice instead of white rice. Make meals that have a lot of beans and vegetables in them, such as chili or vegetable-based soups. Meal planning Start the day with a breakfast that is high in fiber, such  as a cereal that has 5 g of fiber or more per serving. Eat breads and cereals that are made with whole-grain flour instead of refined flour or white flour. Eat brown rice, bulgur wheat, or millet instead of white rice. Use beans in place of meat in soups, salads, and pasta dishes. Be sure that half of the grains you eat each day are whole  grains. General information You can get the recommended amount of dietary fiber by: Eating a variety of fruits, vegetables, grains, nuts, and beans. Taking a fiber supplement if you aren't able to eat enough fiber. It's better to get fiber through food than from a supplement. Slowly increase how much fiber you eat. If you increase the amount of fiber you eat too quickly, you may have bloating, cramping, or gas. Drink plenty of water to help you digest fiber. Choose high-fiber snacks, such as berries, raw vegetables, nuts, and popcorn. What foods should I eat? Fruits Berries. Pears. Apples. Oranges. Avocado. Prunes and raisins. Dried figs. Vegetables Sweet potatoes. Spinach. Kale. Artichokes. Cabbage. Broccoli. Cauliflower. Green peas. Carrots. Squash. Grains Whole-grain breads. Multigrain cereal. Oats and oatmeal. Brown rice. Barley. Bulgur wheat. Millet. Quinoa. Bran muffins. Popcorn. Rye wafer crackers. Meats and other proteins Navy beans, kidney beans, and pinto beans. Soybeans. Split peas. Lentils. Nuts and seeds. Dairy Fiber-fortified yogurt. Fortified means that fiber has been added to the product. Beverages Fiber-fortified soy milk. Fiber-fortified orange juice. Other foods Fiber bars. The items listed above may not be all the foods and drinks you can have. Talk to a dietitian to learn more. What foods should I avoid? Fruits Fruit juice. Cooked, strained fruit. Vegetables Fried potatoes. Canned vegetables. Well-cooked vegetables. Grains White bread. Pasta made with refined flour. White rice. Meats and other proteins Fatty meat. Fried chicken or fried fish. Dairy Milk. Cream cheese. Sour cream. Fats and oils Butters. Beverages Soft drinks. Other foods Cakes and pastries. The items listed above may not be all the foods and drinks you should avoid. Talk to a dietitian to learn more. This information is not intended to replace advice given to you by your health care  provider. Make sure you discuss any questions you have with your health care provider. Document Revised: 09/25/2022 Document Reviewed: 09/25/2022 Elsevier Patient Education  2024 [employer].

## 2023-05-19 LAB — LIPID PANEL
Chol/HDL Ratio: 3 ratio (ref 0.0–5.0)
Cholesterol, Total: 187 mg/dL (ref 100–199)
HDL: 62 mg/dL (ref >39)
LDL Chol Calc (NIH): 110 mg/dL — ABNORMAL HIGH (ref 0–99)
Triglycerides: 83 mg/dL (ref 0–149)
VLDL Cholesterol Cal: 15 mg/dL (ref 5–40)

## 2023-05-19 LAB — HEPATIC FUNCTION PANEL
ALT: 35 [IU]/L (ref 0–44)
AST: 26 [IU]/L (ref 0–40)
Albumin: 4.7 g/dL (ref 3.9–4.9)
Alkaline Phosphatase: 67 [IU]/L (ref 44–121)
Bilirubin Total: 0.5 mg/dL (ref 0.0–1.2)
Bilirubin, Direct: 0.12 mg/dL (ref 0.00–0.40)
Total Protein: 7 g/dL (ref 6.0–8.5)

## 2023-05-28 ENCOUNTER — Other Ambulatory Visit: Payer: Self-pay

## 2023-05-28 DIAGNOSIS — E782 Mixed hyperlipidemia: Secondary | ICD-10-CM

## 2023-05-28 DIAGNOSIS — I2510 Atherosclerotic heart disease of native coronary artery without angina pectoris: Secondary | ICD-10-CM

## 2023-07-31 DIAGNOSIS — G4733 Obstructive sleep apnea (adult) (pediatric): Secondary | ICD-10-CM | POA: Diagnosis not present

## 2023-07-31 DIAGNOSIS — R0981 Nasal congestion: Secondary | ICD-10-CM | POA: Diagnosis not present

## 2023-08-27 DIAGNOSIS — H9313 Tinnitus, bilateral: Secondary | ICD-10-CM | POA: Diagnosis not present

## 2023-08-27 DIAGNOSIS — Z77122 Contact with and (suspected) exposure to noise: Secondary | ICD-10-CM | POA: Diagnosis not present

## 2023-08-27 DIAGNOSIS — H903 Sensorineural hearing loss, bilateral: Secondary | ICD-10-CM | POA: Diagnosis not present

## 2024-02-13 DIAGNOSIS — Z5181 Encounter for therapeutic drug level monitoring: Secondary | ICD-10-CM | POA: Diagnosis not present

## 2024-02-13 DIAGNOSIS — Z125 Encounter for screening for malignant neoplasm of prostate: Secondary | ICD-10-CM | POA: Diagnosis not present

## 2024-02-13 DIAGNOSIS — Z79899 Other long term (current) drug therapy: Secondary | ICD-10-CM | POA: Diagnosis not present

## 2024-02-13 DIAGNOSIS — E785 Hyperlipidemia, unspecified: Secondary | ICD-10-CM | POA: Diagnosis not present

## 2024-02-13 DIAGNOSIS — Z0000 Encounter for general adult medical examination without abnormal findings: Secondary | ICD-10-CM | POA: Diagnosis not present

## 2024-02-13 LAB — LAB REPORT - SCANNED: EGFR: 76

## 2024-02-18 ENCOUNTER — Other Ambulatory Visit: Payer: Self-pay | Admitting: Orthopedic Surgery

## 2024-02-18 DIAGNOSIS — M25572 Pain in left ankle and joints of left foot: Secondary | ICD-10-CM | POA: Diagnosis not present

## 2024-02-27 ENCOUNTER — Inpatient Hospital Stay: Admission: RE | Admit: 2024-02-27 | Source: Ambulatory Visit

## 2024-02-27 ENCOUNTER — Other Ambulatory Visit

## 2024-04-30 DIAGNOSIS — M5432 Sciatica, left side: Secondary | ICD-10-CM | POA: Diagnosis not present

## 2024-04-30 DIAGNOSIS — M79605 Pain in left leg: Secondary | ICD-10-CM | POA: Diagnosis not present

## 2024-05-08 ENCOUNTER — Other Ambulatory Visit (HOSPITAL_BASED_OUTPATIENT_CLINIC_OR_DEPARTMENT_OTHER): Payer: Self-pay

## 2024-05-08 MED ORDER — BOOSTRIX 5-2.5-18.5 LF-MCG/0.5 IM SUSY
0.5000 mL | PREFILLED_SYRINGE | Freq: Once | INTRAMUSCULAR | 0 refills | Status: AC
  Filled 2024-05-08: qty 0.5, 1d supply, fill #0

## 2024-05-28 DIAGNOSIS — M5432 Sciatica, left side: Secondary | ICD-10-CM | POA: Diagnosis not present

## 2024-08-04 ENCOUNTER — Other Ambulatory Visit: Payer: Self-pay | Admitting: General Practice

## 2024-08-11 NOTE — Telephone Encounter (Signed)
 Schedule patient an appointment with Dr. Verlin, next available. Patient will get needed lab work at that visit.

## 2024-08-20 ENCOUNTER — Other Ambulatory Visit (HOSPITAL_BASED_OUTPATIENT_CLINIC_OR_DEPARTMENT_OTHER): Payer: Self-pay | Admitting: Family Medicine

## 2024-08-20 DIAGNOSIS — E785 Hyperlipidemia, unspecified: Secondary | ICD-10-CM

## 2024-09-03 ENCOUNTER — Other Ambulatory Visit (HOSPITAL_BASED_OUTPATIENT_CLINIC_OR_DEPARTMENT_OTHER)

## 2024-11-11 ENCOUNTER — Ambulatory Visit: Admitting: Cardiovascular Disease
